# Patient Record
Sex: Male | Born: 1950 | Race: Black or African American | Hispanic: No | Marital: Single | Smoking: Former smoker
Health system: Southern US, Community
[De-identification: ages and names within clinical notes are randomized; demographics above are authoritative.]

## PROBLEM LIST (undated history)

## (undated) DIAGNOSIS — T148XXA Other injury of unspecified body region, initial encounter: Secondary | ICD-10-CM

## (undated) DIAGNOSIS — Z8619 Personal history of other infectious and parasitic diseases: Secondary | ICD-10-CM

## (undated) DIAGNOSIS — M1990 Unspecified osteoarthritis, unspecified site: Secondary | ICD-10-CM

## (undated) HISTORY — DX: Personal history of other infectious and parasitic diseases: Z86.19

## (undated) HISTORY — PX: TOTAL HIP ARTHROPLASTY: SHX124

## (undated) HISTORY — DX: Other injury of unspecified body region, initial encounter: T14.8XXA

## (undated) HISTORY — DX: Unspecified osteoarthritis, unspecified site: M19.90

## (undated) HISTORY — PX: ABDOMINAL SURGERY: SHX537

---

## 1988-05-29 DIAGNOSIS — T148XXA Other injury of unspecified body region, initial encounter: Secondary | ICD-10-CM

## 1988-05-29 HISTORY — DX: Other injury of unspecified body region, initial encounter: T14.8XXA

## 2009-06-25 ENCOUNTER — Emergency Department (HOSPITAL_BASED_OUTPATIENT_CLINIC_OR_DEPARTMENT_OTHER): Admission: EM | Admit: 2009-06-25 | Discharge: 2009-06-25 | Payer: Self-pay | Admitting: Emergency Medicine

## 2009-07-10 ENCOUNTER — Emergency Department (HOSPITAL_BASED_OUTPATIENT_CLINIC_OR_DEPARTMENT_OTHER): Admission: EM | Admit: 2009-07-10 | Discharge: 2009-07-10 | Payer: Self-pay | Admitting: Emergency Medicine

## 2009-07-31 ENCOUNTER — Emergency Department (HOSPITAL_BASED_OUTPATIENT_CLINIC_OR_DEPARTMENT_OTHER): Admission: EM | Admit: 2009-07-31 | Discharge: 2009-07-31 | Payer: Self-pay | Admitting: Emergency Medicine

## 2010-03-08 ENCOUNTER — Emergency Department (HOSPITAL_BASED_OUTPATIENT_CLINIC_OR_DEPARTMENT_OTHER): Admission: EM | Admit: 2010-03-08 | Discharge: 2010-03-08 | Payer: Self-pay | Admitting: Emergency Medicine

## 2010-04-15 ENCOUNTER — Emergency Department (HOSPITAL_BASED_OUTPATIENT_CLINIC_OR_DEPARTMENT_OTHER): Admission: EM | Admit: 2010-04-15 | Discharge: 2010-04-15 | Payer: Self-pay | Admitting: Emergency Medicine

## 2010-04-26 ENCOUNTER — Emergency Department (HOSPITAL_BASED_OUTPATIENT_CLINIC_OR_DEPARTMENT_OTHER)
Admission: EM | Admit: 2010-04-26 | Discharge: 2010-04-26 | Payer: Self-pay | Source: Home / Self Care | Admitting: Emergency Medicine

## 2010-05-09 ENCOUNTER — Emergency Department (HOSPITAL_BASED_OUTPATIENT_CLINIC_OR_DEPARTMENT_OTHER)
Admission: EM | Admit: 2010-05-09 | Discharge: 2010-05-09 | Payer: Self-pay | Source: Home / Self Care | Admitting: Emergency Medicine

## 2010-05-24 ENCOUNTER — Ambulatory Visit: Payer: Self-pay | Admitting: Family

## 2010-05-27 ENCOUNTER — Ambulatory Visit (HOSPITAL_BASED_OUTPATIENT_CLINIC_OR_DEPARTMENT_OTHER)
Admission: RE | Admit: 2010-05-27 | Discharge: 2010-05-27 | Payer: Self-pay | Source: Home / Self Care | Attending: Internal Medicine | Admitting: Internal Medicine

## 2010-05-27 ENCOUNTER — Telehealth: Payer: Self-pay | Admitting: Family

## 2010-05-27 DIAGNOSIS — M79606 Pain in leg, unspecified: Secondary | ICD-10-CM

## 2010-05-27 DIAGNOSIS — R683 Clubbing of fingers: Secondary | ICD-10-CM | POA: Insufficient documentation

## 2010-05-27 DIAGNOSIS — G8929 Other chronic pain: Secondary | ICD-10-CM

## 2010-06-13 ENCOUNTER — Inpatient Hospital Stay (HOSPITAL_COMMUNITY)
Admission: RE | Admit: 2010-06-13 | Discharge: 2010-06-16 | Payer: Self-pay | Source: Home / Self Care | Attending: Orthopedic Surgery | Admitting: Orthopedic Surgery

## 2010-06-13 HISTORY — PX: JOINT REPLACEMENT: SHX530

## 2010-06-13 LAB — DIFFERENTIAL
Basophils Absolute: 0 10*3/uL (ref 0.0–0.1)
Basophils Relative: 1 % (ref 0–1)
Eosinophils Absolute: 0.1 10*3/uL (ref 0.0–0.7)
Eosinophils Relative: 1 % (ref 0–5)
Lymphocytes Relative: 40 % (ref 12–46)
Lymphs Abs: 1.4 10*3/uL (ref 0.7–4.0)
Monocytes Absolute: 0.7 10*3/uL (ref 0.1–1.0)
Monocytes Relative: 19 % — ABNORMAL HIGH (ref 3–12)
Neutro Abs: 1.4 10*3/uL — ABNORMAL LOW (ref 1.7–7.7)
Neutrophils Relative %: 40 % — ABNORMAL LOW (ref 43–77)

## 2010-06-13 LAB — CBC
HCT: 43.5 % (ref 39.0–52.0)
Hemoglobin: 15.5 g/dL (ref 13.0–17.0)
MCH: 31.6 pg (ref 26.0–34.0)
MCHC: 35.6 g/dL (ref 30.0–36.0)
MCV: 88.8 fL (ref 78.0–100.0)
Platelets: 157 10*3/uL (ref 150–400)
RBC: 4.9 MIL/uL (ref 4.22–5.81)
RDW: 13 % (ref 11.5–15.5)
WBC: 3.5 10*3/uL — ABNORMAL LOW (ref 4.0–10.5)

## 2010-06-13 LAB — HEPATIC FUNCTION PANEL
ALT: 114 U/L — ABNORMAL HIGH (ref 0–53)
AST: 119 U/L — ABNORMAL HIGH (ref 0–37)
Albumin: 4 g/dL (ref 3.5–5.2)
Alkaline Phosphatase: 70 U/L (ref 39–117)
Bilirubin, Direct: 0.4 mg/dL — ABNORMAL HIGH (ref 0.0–0.3)
Indirect Bilirubin: 0.9 mg/dL (ref 0.3–0.9)
Total Bilirubin: 1.3 mg/dL — ABNORMAL HIGH (ref 0.3–1.2)
Total Protein: 7.8 g/dL (ref 6.0–8.3)

## 2010-06-13 LAB — TYPE AND SCREEN
ABO/RH(D): A POS
Antibody Screen: NEGATIVE

## 2010-06-13 LAB — PROTIME-INR
INR: 0.96 (ref 0.00–1.49)
Prothrombin Time: 13 seconds (ref 11.6–15.2)

## 2010-06-13 LAB — BASIC METABOLIC PANEL
BUN: 6 mg/dL (ref 6–23)
CO2: 28 mEq/L (ref 19–32)
Calcium: 9.7 mg/dL (ref 8.4–10.5)
Chloride: 101 mEq/L (ref 96–112)
Creatinine, Ser: 0.86 mg/dL (ref 0.4–1.5)
GFR calc Af Amer: >60 mL/min (ref >60)
GFR calc non Af Amer: >60 mL/min (ref >60)
Glucose, Bld: 92 mg/dL (ref 70–99)
Potassium: 4.6 mEq/L (ref 3.5–5.1)
Sodium: 135 mEq/L (ref 135–145)

## 2010-06-13 LAB — URINALYSIS, ROUTINE W REFLEX MICROSCOPIC
Bilirubin Urine: NEGATIVE
Hgb urine dipstick: NEGATIVE
Ketones, ur: NEGATIVE mg/dL
Nitrite: POSITIVE — AB
Protein, ur: NEGATIVE mg/dL
Specific Gravity, Urine: 1.014 (ref 1.005–1.030)
Urine Glucose, Fasting: NEGATIVE mg/dL
Urobilinogen, UA: 4 mg/dL — ABNORMAL HIGH (ref 0.0–1.0)
pH: 6.5 (ref 5.0–8.0)

## 2010-06-13 LAB — APTT: aPTT: 37 seconds (ref 24–37)

## 2010-06-13 LAB — SURGICAL PCR SCREEN
MRSA, PCR: NEGATIVE
Staphylococcus aureus: NEGATIVE

## 2010-06-13 LAB — URINE MICROSCOPIC-ADD ON

## 2010-06-13 LAB — ABO/RH: ABO/RH(D): A POS

## 2010-06-15 LAB — BASIC METABOLIC PANEL
BUN: 7 mg/dL (ref 6–23)
CO2: 28 mEq/L (ref 19–32)
Calcium: 8.1 mg/dL — ABNORMAL LOW (ref 8.4–10.5)
Chloride: 101 mEq/L (ref 96–112)
Creatinine, Ser: 0.94 mg/dL (ref 0.4–1.5)
GFR calc Af Amer: >60 mL/min (ref >60)
GFR calc non Af Amer: >60 mL/min (ref >60)
Glucose, Bld: 124 mg/dL — ABNORMAL HIGH (ref 70–99)
Potassium: 3.7 mEq/L (ref 3.5–5.1)
Sodium: 133 mEq/L — ABNORMAL LOW (ref 135–145)

## 2010-06-15 LAB — CBC
HCT: 26.8 % — ABNORMAL LOW (ref 39.0–52.0)
Hemoglobin: 9.5 g/dL — ABNORMAL LOW (ref 13.0–17.0)
MCH: 31 pg (ref 26.0–34.0)
MCHC: 35.4 g/dL (ref 30.0–36.0)
MCV: 87.6 fL (ref 78.0–100.0)
Platelets: 125 10*3/uL — ABNORMAL LOW (ref 150–400)
RBC: 3.06 MIL/uL — ABNORMAL LOW (ref 4.22–5.81)
RDW: 12.6 % (ref 11.5–15.5)
WBC: 5.3 10*3/uL (ref 4.0–10.5)

## 2010-06-15 LAB — PROTIME-INR
INR: 1.12 (ref 0.00–1.49)
Prothrombin Time: 14.6 seconds (ref 11.6–15.2)
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
## 2010-06-20 LAB — CBC
HCT: 25.3 % — ABNORMAL LOW (ref 39.0–52.0)
HCT: 24.3 % — ABNORMAL LOW (ref 39.0–52.0)
Hemoglobin: 8.8 g/dL — ABNORMAL LOW (ref 13.0–17.0)
Hemoglobin: 8.4 g/dL — ABNORMAL LOW (ref 13.0–17.0)
MCH: 30.8 pg (ref 26.0–34.0)
MCH: 30.4 pg (ref 26.0–34.0)
MCHC: 34.8 g/dL (ref 30.0–36.0)
MCHC: 34.6 g/dL (ref 30.0–36.0)
MCV: 88.5 fL (ref 78.0–100.0)
MCV: 88 fL (ref 78.0–100.0)
Platelets: 116 10*3/uL — ABNORMAL LOW (ref 150–400)
Platelets: 141 10*3/uL — ABNORMAL LOW (ref 150–400)
RBC: 2.86 MIL/uL — ABNORMAL LOW (ref 4.22–5.81)
RBC: 2.76 MIL/uL — ABNORMAL LOW (ref 4.22–5.81)
RDW: 12.3 % (ref 11.5–15.5)
RDW: 12.2 % (ref 11.5–15.5)
WBC: 5.3 10*3/uL (ref 4.0–10.5)
WBC: 5.6 10*3/uL (ref 4.0–10.5)

## 2010-06-20 LAB — PROTIME-INR
INR: 1.44 (ref 0.00–1.49)
INR: 1.66 — ABNORMAL HIGH (ref 0.00–1.49)
Prothrombin Time: 17.7 seconds — ABNORMAL HIGH (ref 11.6–15.2)
Prothrombin Time: 19.8 seconds — ABNORMAL HIGH (ref 11.6–15.2)

## 2010-06-21 ENCOUNTER — Ambulatory Visit: Admit: 2010-06-21 | Payer: Self-pay | Admitting: Family

## 2010-06-22 ENCOUNTER — Observation Stay (HOSPITAL_COMMUNITY)
Admission: EM | Admit: 2010-06-22 | Discharge: 2010-07-01 | Disposition: A | Discharge status: Skilled Nursing Facility | Payer: Medicare Other | Attending: Internal Medicine | Admitting: Internal Medicine

## 2010-06-22 DIAGNOSIS — F1410 Cocaine abuse, uncomplicated: Secondary | ICD-10-CM | POA: Insufficient documentation

## 2010-06-22 DIAGNOSIS — Z96649 Presence of unspecified artificial hip joint: Secondary | ICD-10-CM | POA: Insufficient documentation

## 2010-06-22 DIAGNOSIS — M25559 Pain in unspecified hip: Principal | ICD-10-CM | POA: Insufficient documentation

## 2010-06-22 DIAGNOSIS — I10 Essential (primary) hypertension: Secondary | ICD-10-CM | POA: Insufficient documentation

## 2010-06-22 DIAGNOSIS — D62 Acute posthemorrhagic anemia: Secondary | ICD-10-CM | POA: Insufficient documentation

## 2010-06-22 DIAGNOSIS — R29898 Other symptoms and signs involving the musculoskeletal system: Secondary | ICD-10-CM | POA: Insufficient documentation

## 2010-06-22 DIAGNOSIS — Z7901 Long term (current) use of anticoagulants: Secondary | ICD-10-CM | POA: Insufficient documentation

## 2010-06-22 LAB — DIFFERENTIAL
Basophils Absolute: 0 10*3/uL (ref 0.0–0.1)
Basophils Relative: 0 % (ref 0–1)
Eosinophils Absolute: 0 10*3/uL (ref 0.0–0.7)
Eosinophils Relative: 0 % (ref 0–5)
Lymphocytes Relative: 11 % — ABNORMAL LOW (ref 12–46)
Lymphs Abs: 1.4 10*3/uL (ref 0.7–4.0)
Monocytes Absolute: 1.9 10*3/uL — ABNORMAL HIGH (ref 0.1–1.0)
Monocytes Relative: 15 % — ABNORMAL HIGH (ref 3–12)
Neutro Abs: 9.3 10*3/uL — ABNORMAL HIGH (ref 1.7–7.7)
Neutrophils Relative %: 74 % (ref 43–77)

## 2010-06-22 LAB — RAPID URINE DRUG SCREEN, HOSP PERFORMED
Amphetamines: NOT DETECTED
Barbiturates: NOT DETECTED
Benzodiazepines: NOT DETECTED
Cocaine: POSITIVE — AB
Opiates: POSITIVE — AB
Tetrahydrocannabinol: NOT DETECTED

## 2010-06-22 LAB — POCT I-STAT, CHEM 8
BUN: 34 mg/dL — ABNORMAL HIGH (ref 6–23)
Calcium, Ion: 1.18 mmol/L (ref 1.12–1.32)
Chloride: 104 mEq/L (ref 96–112)
Creatinine, Ser: 1.2 mg/dL (ref 0.4–1.5)
Glucose, Bld: 133 mg/dL — ABNORMAL HIGH (ref 70–99)
HCT: 26 % — ABNORMAL LOW (ref 39.0–52.0)
Hemoglobin: 8.8 g/dL — ABNORMAL LOW (ref 13.0–17.0)
Potassium: 4.9 mEq/L (ref 3.5–5.1)
Sodium: 136 mEq/L (ref 135–145)
TCO2: 26 mmol/L (ref 0–100)

## 2010-06-22 LAB — CBC
HCT: 24.6 % — ABNORMAL LOW (ref 39.0–52.0)
Hemoglobin: 8.5 g/dL — ABNORMAL LOW (ref 13.0–17.0)
MCH: 30.7 pg (ref 26.0–34.0)
MCHC: 34.6 g/dL (ref 30.0–36.0)
MCV: 88.8 fL (ref 78.0–100.0)
Platelets: 398 10*3/uL (ref 150–400)
RBC: 2.77 MIL/uL — ABNORMAL LOW (ref 4.22–5.81)
RDW: 14.3 % (ref 11.5–15.5)
WBC: 12.6 10*3/uL — ABNORMAL HIGH (ref 4.0–10.5)

## 2010-06-22 LAB — PROTIME-INR
INR: 4.98 — ABNORMAL HIGH (ref 0.00–1.49)
Prothrombin Time: 46.1 seconds — ABNORMAL HIGH (ref 11.6–15.2)

## 2010-06-23 LAB — CBC
MCH: 29.5 pg (ref 26.0–34.0)
Platelets: 318 10*3/uL (ref 150–400)
RBC: 2.51 MIL/uL — ABNORMAL LOW (ref 4.22–5.81)
RDW: 14.3 % (ref 11.5–15.5)

## 2010-06-23 LAB — BASIC METABOLIC PANEL
CO2: 25 mEq/L (ref 19–32)
Calcium: 8.7 mg/dL (ref 8.4–10.5)
Creatinine, Ser: 0.97 mg/dL (ref 0.4–1.5)
GFR calc Af Amer: >60 mL/min (ref >60)

## 2010-06-23 LAB — PROTIME-INR: INR: 4.12 — ABNORMAL HIGH (ref 0.00–1.49)

## 2010-06-24 LAB — DIFFERENTIAL
Basophils Absolute: 0 10*3/uL (ref 0.0–0.1)
Basophils Relative: 1 % (ref 0–1)
Lymphocytes Relative: 31 % (ref 12–46)
Neutro Abs: 3.6 10*3/uL (ref 1.7–7.7)
Neutrophils Relative %: 55 % (ref 43–77)

## 2010-06-24 LAB — CBC
HCT: 20.5 % — ABNORMAL LOW (ref 39.0–52.0)
Hemoglobin: 6.8 g/dL — CL (ref 13.0–17.0)
WBC: 6.6 10*3/uL (ref 4.0–10.5)

## 2010-06-24 LAB — MAGNESIUM: Magnesium: 2.1 mg/dL (ref 1.5–2.5)

## 2010-06-24 LAB — COMPREHENSIVE METABOLIC PANEL
ALT: 50 U/L (ref 0–53)
BUN: 14 mg/dL (ref 6–23)
Calcium: 8.2 mg/dL — ABNORMAL LOW (ref 8.4–10.5)
Creatinine, Ser: 0.89 mg/dL (ref 0.4–1.5)
Glucose, Bld: 90 mg/dL (ref 70–99)
Sodium: 138 mEq/L (ref 135–145)
Total Protein: 5.4 g/dL — ABNORMAL LOW (ref 6.0–8.3)

## 2010-06-24 LAB — PHOSPHORUS
Phosphorus: 3 mg/dL (ref 2.3–4.6)
Phosphorus: 2.7 mg/dL (ref 2.3–4.6)

## 2010-06-24 LAB — FOLATE: Folate: 7.9 ng/mL

## 2010-06-24 LAB — FERRITIN: Ferritin: 701 ng/mL — ABNORMAL HIGH (ref 22–322)

## 2010-06-24 LAB — IRON AND TIBC
Iron: 62 ug/dL (ref 42–135)
UIBC: 118 ug/dL

## 2010-06-24 LAB — ABO/RH: ABO/RH(D): A POS

## 2010-06-24 LAB — PROTIME-INR: Prothrombin Time: 18.7 seconds — ABNORMAL HIGH (ref 11.6–15.2)

## 2010-06-24 LAB — HEMOCCULT GUIAC POC 1CARD (OFFICE): Fecal Occult Bld: NEGATIVE

## 2010-06-25 LAB — DIFFERENTIAL
Basophils Absolute: 0 10*3/uL (ref 0.0–0.1)
Basophils Relative: 0 % (ref 0–1)
Eosinophils Relative: 2 % (ref 0–5)
Lymphocytes Relative: 23 % (ref 12–46)
Monocytes Absolute: 0.9 10*3/uL (ref 0.1–1.0)

## 2010-06-25 LAB — CBC
HCT: 23.7 % — ABNORMAL LOW (ref 39.0–52.0)
MCHC: 34.2 g/dL (ref 30.0–36.0)
Platelets: 333 10*3/uL (ref 150–400)
RDW: 16.4 % — ABNORMAL HIGH (ref 11.5–15.5)
WBC: 7.2 10*3/uL (ref 4.0–10.5)

## 2010-06-25 LAB — BASIC METABOLIC PANEL
Calcium: 8.5 mg/dL (ref 8.4–10.5)
GFR calc Af Amer: >60 mL/min (ref >60)
GFR calc non Af Amer: >60 mL/min (ref >60)
Potassium: 4.1 mEq/L (ref 3.5–5.1)
Sodium: 139 mEq/L (ref 135–145)

## 2010-06-25 LAB — PROTIME-INR
INR: 1.12 (ref 0.00–1.49)
Prothrombin Time: 14.6 seconds (ref 11.6–15.2)

## 2010-06-25 LAB — MAGNESIUM: Magnesium: 2.1 mg/dL (ref 1.5–2.5)

## 2010-06-26 LAB — CBC
HCT: 24.5 % — ABNORMAL LOW (ref 39.0–52.0)
MCHC: 33.9 g/dL (ref 30.0–36.0)
Platelets: 382 10*3/uL (ref 150–400)
RDW: 17.3 % — ABNORMAL HIGH (ref 11.5–15.5)
WBC: 8.1 10*3/uL (ref 4.0–10.5)

## 2010-06-26 LAB — PROTIME-INR: INR: 1.04 (ref 0.00–1.49)
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
## 2010-06-27 ENCOUNTER — Telehealth: Payer: Self-pay | Admitting: Family

## 2010-06-27 LAB — PROTIME-INR: Prothrombin Time: 13.6 seconds (ref 11.6–15.2)

## 2010-06-28 LAB — URINE MICROSCOPIC-ADD ON

## 2010-06-28 LAB — RAPID URINE DRUG SCREEN, HOSP PERFORMED
Amphetamines: NOT DETECTED
Barbiturates: NOT DETECTED
Tetrahydrocannabinol: NOT DETECTED

## 2010-06-28 LAB — PROTIME-INR: INR: 1.02 (ref 0.00–1.49)

## 2010-06-28 LAB — TYPE AND SCREEN
ABO/RH(D): A POS
Antibody Screen: NEGATIVE
Unit division: 0
Unit division: 0

## 2010-06-28 LAB — T4, FREE: Free T4: 1.09 ng/dL (ref 0.80–1.80)

## 2010-06-28 LAB — URINALYSIS, ROUTINE W REFLEX MICROSCOPIC
Nitrite: NEGATIVE
Specific Gravity, Urine: 1.011 (ref 1.005–1.030)
Urobilinogen, UA: 1 mg/dL (ref 0.0–1.0)

## 2010-06-29 LAB — PROTIME-INR
INR: 1.01 (ref 0.00–1.49)
Prothrombin Time: 13.5 seconds (ref 11.6–15.2)

## 2010-06-30 ENCOUNTER — Encounter: Payer: Self-pay | Admitting: Family

## 2010-06-30 LAB — PROTIME-INR
INR: 1.06 (ref 0.00–1.49)
Prothrombin Time: 14 seconds (ref 11.6–15.2)

## 2010-06-30 NOTE — Progress Notes (Signed)
Summary: Xray Results  Phone Note Outgoing Call   Summary of Call: Please call patient and let him know that his x-ray looks normal.  Hip x-ray shows severe degeneration of the right hip.  I will refer him to orthopedics for evaluation.   Initial call taken by: Lemont Fillers FNP,  May 27, 2010 1:06 PM  Follow-up for Phone Call        call placed to patient at [phone], male individiual stated patient was not available. Message was left for patient to return call. Follow-up by: Glendell Docker CMA,  May 27, 2010 1:54 PM  Additional Follow-up for Phone Call Additional follow up Details #1::        Left message with pt's father to have pt return my call. Nicki Guadalajara Fergerson CMA Duncan Dull)  May 31, 2010 9:27 AM     Additional Follow-up for Phone Call Additional follow up Details #2::    Pt notified and states he is going to Ortho today. Nicki Guadalajara Fergerson CMA Duncan Dull)  June 01, 2010 8:50 AM

## 2010-06-30 NOTE — Progress Notes (Signed)
Summary: FYI  Phone Note Call from Patient   Caller: Patient Summary of Call: FYI:  Pt stated he was just going to wait for pain management to contact him about an appt and gave papers back to me for xrays. He states that Tylenol does not help him and he did not understand why he was referred to [place] anyway. I asked him if he had a primary care doctor and he said no he was going to get one. I advised pt that Efraim Kaufmann is a Nature conservation officer and he asked for his papers back. I gave pt instruction sheet and xray orders back. Fleet Contras in radiology states that pt did have xrays completed today. Nicki Guadalajara Fergerson CMA Duncan Dull)  May 27, 2010 11:27 AM
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
## 2010-07-01 ENCOUNTER — Encounter: Payer: Self-pay | Admitting: Family

## 2010-07-01 LAB — PROTIME-INR: Prothrombin Time: 13.8 seconds (ref 11.6–15.2)

## 2010-07-03 NOTE — Discharge Summary (Signed)
  NAME:  Randy [name], Randy [name] NO.:  [id number]  MEDICAL RECORD NO.:  [id number]          PATIENT TYPE:  OBV  LOCATION:  1521                         FACILITY[facility]  PHYSICIAN:  Osvaldo Shipper, MD     DATE OF BIRTH:  [birth year]  DATE OF ADMISSION:  06/22/2010 DATE OF DISCHARGE:                        DISCHARGE SUMMARY - REFERRING   ADDENDUM  The patient ultimately is going to the skilled nursing facility today, July 01, 2010.  Please review the discharge summary dictated on February 1 for details regarding the patient's inpatient stay.  There are no significant changes made to that discharge summary.  The medications list is the same as before.  Basically the last two days the patient has not had any new issues.  The only thing that we have noticed is that his supine blood pressure tends sometimes to run high.  It was 160/83 this morning. When he stands up it drops to 109/77.  I would recommend that the nursing facility monitor his blood pressure closely and if he continues to remain hypertensive consider antihypertensive agents.  Otherwise other recommendations as dictated on the other discharge summary.  On the day of discharge the patient is feeling well.  He denies any complaints.  He has been eating well.  He is wondering when he can take a bath and I told him that he can take a bath when he gets to his skilled nursing facility.  PHYSICAL EXAMINATION:  His vital signs, as I mentioned above except for his blood pressure, everything else is quite stable.  He is saturating 100% on room air.  His lungs are clear.  Cardiovascular exam was benign. Really no changes on examination.  He does continue to have weakness in the right leg, especially in the ankle flexors and extensors and once again, he will need followup with Dr. Turner Daniels for the same.  ASSESSMENT AND PLAN:  Essentially no change to assessment and plan.  The patient will be going to the skilled  nursing facility later today.     Osvaldo Shipper, MD     GK/MEDQ  D:  07/01/2010  T:  07/01/2010  Job:  [id number]  Electronically Signed by Osvaldo Shipper MD on 07/03/2010 04:46:48 PM

## 2010-07-03 NOTE — Discharge Summary (Signed)
NAME:  Randy, [name] NO.:  [id number]  MEDICAL RECORD NO.:  [id number]          PATIENT TYPE:  OBV  LOCATION:  1521                         FACILITY:  [facility]  PHYSICIAN:  Osvaldo Shipper, MD     DATE OF BIRTH:  1951/05/02  DATE OF ADMISSION:  06/22/2010 DATE OF DISCHARGE:  06/29/2010                        DISCHARGE SUMMARY - REFERRING   I am anticipating that he will be able to go to the skilled nursing facility later today.  CONSULTATIONS:  Consultation during this admission include Dr. [name] from Orthopedics Department.  Imaging studies done during this admission include a right hip film which showed no acute bony findings, stable position and appearance of the right hip prosthesis.  DISCHARGE DIAGNOSES: 1. Right hip pain and weakness, etiology unclear, will require further     outpatient evaluation. 2. History of polysubstance abuse with cocaine positive in the urine. 3. History of narcotic drug seeking behavior. 4. History of acute blood loss anemia, stable. 5. History of hypertension, stable.  BRIEF HOSPITAL COURSE:  Briefly, this is a 60 year old African-American male who presents to the hospital with complaints of weakness in the right lower extremity.  He had motor deficits in the ankle as well as in his toes.  He had numbness and sensory deficits there as well.  He underwent hip arthroplasty on June 14, 2010, done by Dr. Turner [name].  It was felt that it may have been a complication of the surgery, so Dr. Turner [name] was consulted.  Dr. Turner [name] felt that the patient may require skilled nursing facility placement.  He feels that we need to wait about 2 to 3 weeks to see if there is any improvement in his weakness and if there is none, then EMG and nerve conduction studies may have to be done as an outpatient.  So this will be recommended.  The patient also had some drug seeking behavior for which he was taken off his narcotics completely and he was  put just on NSAIDs. 1. He has history of cocaine abuse for which he was counseled. 2. He had acute to blood loss anemia with hemoglobin dropping into 6.8     for which he was transfused.  Coumadin was held.  It was felt that     the blood loss was into the right thigh.  Hemoglobin has been     stable since. 3. History of hypertension which is stable as well.  We would recommend close monitoring of his blood pressures and initiating antihypertensive treatment as needed.  DISCHARGE PHYSICAL EXAMINATION:  Today that is June 29, 2010, the patient still has the right lower extremity pain and weakness.  However, he denies any other complaints.  He has been able to ambulate with a walker.  His vital signs are all stable.  Blood pressure this morning was 144/75.  Other vital signs are stable.  His lungs are clear to auscultation bilaterally with no wheezing, rales or rhonchi. Cardiovascular, S1, S2 normal and regular.  No S3, S4, rubs, murmurs or bruit.  Abdomen is soft, nontender, nondistended.  Bowel sounds are present.  No masses, organomegaly appreciated.  Right  hip area with the wound appeared to be stable.  No infection is noted.  He still has weakness on the right lower extremity especially in the ankle flexors.  DISCHARGE MEDICATIONS: 1. Acetaminophen 650 mg every 4 hours as needed for pain. 2. Ensure 1 can p.o. b.i.d. 3. Ferrous sulfate 325 mg twice daily for 30 days. 4. Ibuprofen 800 mg every 6 hours as needed for pain. 5. Robaxin 500 mg every 4 hours as needed for pain. 6. MiraLax 17 g p.o. daily. 7. Senokot 2 tablets daily as needed for constipation. 8. Dulera 1 puff inhaled twice daily as needed.  DIET:  Heart healthy.  ACTIVITY:  Physical activity per PT, OT.  OTHER RECOMMENDATIONS:  Nerve conduction velocities and electromyelogram to be done in 3 to 4 weeks per Dr. Turner [name].  Please schedule appointment with Dr. Turner [name] in 2 weeks' time so that this can be facilitated;    With Dr. Kelle Darting who will be the patient's primary care physician.  Total time on this discharge encounter is 35 minutes.  Osvaldo Shipper, MD     GK/MEDQ  D:  06/29/2010  T:  06/29/2010  Job:  [id number]  cc:   Feliberto Gottron. Randy [name], M.D. Fax: [phone]  Eugenio Hoes. Tawanna Cooler, MD [address] [place] [address]  Electronically Signed by Osvaldo Shipper MD on 07/03/2010 04:46:15 PM

## 2010-07-06 NOTE — Progress Notes (Signed)
Summary: needs appt, mailed letter  Phone Note Outgoing Call   Summary of Call: Please call patient and arrange a post-hospital follow up in 1 week. Initial call taken by: Lemont Fillers FNP,  June 27, 2010 4:38 PM  Follow-up for Phone Call        Left message for pt to return my call. Nicki Guadalajara Fergerson CMA Duncan Dull)  June 28, 2010 11:48 AM   Left message on machine to return my call. Nicki Guadalajara Fergerson CMA Duncan Dull)  June 30, 2010 10:01 AM   Additional Follow-up for Phone Call Additional follow up Details #1::        Left message on machine to return my call. Contact letter has been mailed to pt to schedule a hospital follow up.  Additional Follow-up by: Mervin Kung CMA Duncan Dull),  July 01, 2010 10:52 AM

## 2010-07-06 NOTE — Letter (Signed)
Summary: Generic Letter  [place] [facility]  [address]   [place], [place] [address]   Phone: [phone]  Fax: [phone]     07/01/2010    [facility] [address] [place], [place]  [address]  Dear Mr. Creary,  Recently we received notification that you were discharged from the hospital and needed to be seen for follow up. We have been unsuccessful in our attempts to reach you by phone.   Please call our office at [phone] Monday through Friday from 8am to 5pm to schedule your follow up appointment.   Sincerely,    Mervin Kung CMA (AAMA)  Appended Document: Generic Letter Mailed.
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
## 2010-07-08 NOTE — Discharge Summary (Signed)
NAME:  Randy [name], Randy [name] NO.:  [id number]  MEDICAL RECORD NO.:  [id number]          PATIENT TYPE:  INP  LOCATION:  5031                         FACILITY:  [facility]  PHYSICIAN:  Feliberto Gottron. Turner Daniels, M.D.   DATE OF BIRTH:  Oct 20, 1950  DATE OF ADMISSION:  06/13/2010 DATE OF DISCHARGE:  06/16/2010                              DISCHARGE SUMMARY   CHIEF COMPLAINT:  Right hip pain.  HISTORY OF PRESENT ILLNESS:  This is a 60 year old gentleman who complains of severe unremitting pain in his right hip despite extensive conservative treatment.  He now desires a surgical intervention.  All risks and benefits of surgery were discussed with the patient.  PAST MEDICAL HISTORY:  Significant for hepatitis B.  PAST SURGICAL HISTORY:  Significant for abdominal surgery.  SOCIAL HISTORY:  He denies the use of alcohol or tobacco, but admits to recreational drug use.  FAMILY HISTORY:  Noncontributory.  ALLERGIES:  He has an allergy to MORPHINE.  PHYSICAL EXAMINATION:  Gross examination of the right hip demonstrates significant pain with attempted internal or external rotation.  He has a negative foot tap and is neurovascular intact.  X-rays of the right hip demonstrate bone-on-bone degenerative joint disease.  PREOP LABORATORY DATA:  White blood cells 3.5, red blood cells 4.9, hemoglobin 15.5, hematocrit 43.5, and platelets 157.  PT 13, INR 0.96, PTT 37.  Sodium 135, potassium 4.6, chloride 101, glucose 92, BUN 6, and creatinine 0.86.  Urinalysis demonstrates trace leukocyte esterase, otherwise within normal limits.  HOSPITAL COURSE:  Randy [name] was admitted to [facility] on June 13, 2010, when he underwent right total hip arthroplasty.  The procedure was performed by Dr. [name] and the patient tolerated it well. Perioperative Foley catheter was placed and he was transferred to the floor on Lovenox and Coumadin for DVT prophylaxis.  On the first postoperative day, he was  awake and alert and reporting good pain control.  Hemoglobin was 9.5 and surgical dressing had scant drainage and he was evaluated by Physical Therapy.  On the second postoperative day, Randy [name] continued to report good pain control.  Hemoglobin was 8.8.  He denied any dizziness or shortness of breath.  He was progressing well with physical therapy.  On postoperative day #3, he was eating well and ambulating independently.  Hemoglobin was 8.4, but he continued to deny any symptoms of anemia.  He was discharged home.  DISPOSITION:  The patient was discharged home on June 16, 2010.  He was weightbearing as tolerated and would return to the clinic in 10 days for x-rays and suture removal.  Home health care would manage his wound, Coumadin, and physical therapy.  He would be on Coumadin for a total of 2 weeks with a target INR of 1.5-2.  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     Shirl Harris, PA   ______________________________ Feliberto Gottron. Turner Daniels, M.D.    JW/MEDQ  D:  07/05/2010  T:  07/05/2010  Job:  [id number]  Electronically Signed by Shirl Harris PA on 07/07/2010 04:54:09 PM Electronically Signed by [name] M.D. on 07/08/2010  10:07:29 PM

## 2010-07-11 ENCOUNTER — Encounter: Payer: Self-pay | Admitting: Family

## 2010-07-11 ENCOUNTER — Ambulatory Visit: Payer: Medicare Other | Admitting: Family

## 2010-07-11 ENCOUNTER — Telehealth: Payer: Self-pay | Admitting: Family

## 2010-07-11 DIAGNOSIS — Z8619 Personal history of other infectious and parasitic diseases: Secondary | ICD-10-CM

## 2010-07-11 DIAGNOSIS — M25559 Pain in unspecified hip: Secondary | ICD-10-CM

## 2010-07-11 DIAGNOSIS — B182 Chronic viral hepatitis C: Secondary | ICD-10-CM | POA: Insufficient documentation

## 2010-07-11 LAB — CONVERTED CEMR LAB: Hep A IgM: NEGATIVE

## 2010-07-13 ENCOUNTER — Telehealth: Payer: Self-pay | Admitting: Family

## 2010-07-13 ENCOUNTER — Encounter: Payer: Self-pay | Admitting: Family

## 2010-07-14 ENCOUNTER — Encounter (INDEPENDENT_AMBULATORY_CARE_PROVIDER_SITE_OTHER): Payer: Self-pay | Admitting: *Deleted

## 2010-07-20 NOTE — Progress Notes (Signed)
Summary: PT referral  Phone Note Outgoing Call   Summary of Call: Could you please call Dr. Wadie Lessen office in AM and let them know that we are going to arrange [facility] PT for Mr.  Carton.  Does he have any restrictions with PT at this point- weight bearing, range of motion etc.? Initial call taken by: Lemont Fillers FNP,  July 11, 2010 4:44 PM  Follow-up for Phone Call        Per Darl Pikes, nurse at [facility], pt has no restrictions for PT. Spoke with patient and let him know that referral has been made.  Also, confirmed with patient that he is no longer on coumadin.  He tells me that Dr. Turner Daniels discontinued this medication. Follow-up by: Mervin Kung CMA Duncan Dull),  July 12, 2010 8:31 AM

## 2010-07-20 NOTE — Letter (Signed)
Summary: Primary Care Consult Scheduled Letter  [place] at [facility]  [address]   [place], [place] [address]   Phone: [phone]  Fax: [phone]      07/14/2010 MRN: [id number]  [facility] [address] [place], [place]  [address]  [place]    Dear Randy [name],      We have scheduled an appointment for you.  At the recommendation of MELISSA O'SULLIVAN,FNP, we have scheduled you a consult with DR Suszanne Finch GASTROENTEROLOGY  on [facility] at 8:30AM  ARRIVE 8:15AM .  Their address is_[address]  [address]  . The office phone number is [phone].  If this appointment day and time is not convenient for you, please feel free to call the office of the doctor you are being referred to at the number listed above and reschedule the appointment.     It is important for you to keep your scheduled appointments. We are here to make sure you are given good patient care.  Thank you,  Darral Dash Patient Care Coordinator [place] at [facility]

## 2010-07-20 NOTE — Progress Notes (Signed)
  Phone Note Outgoing Call   Details for Reason: Hepatitis C Summary of Call: Called patient, reviewed hepatitis C + results noted.  Will plan referral to Dr. Marcelene Butte Hepatologist for further evaluation.  Pt was instructed to keep his upcoming apt in March.  Will initiate referral to Dr. Marcelene Butte Hepatologist in the meantime.  Initial call taken by: Lemont Fillers FNP,  July 13, 2010 2:42 PM  New Problems: HEPATITIS C (ICD-070.51)   New Problems: HEPATITIS C (ICD-070.51)
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
## 2010-07-20 NOTE — Progress Notes (Signed)
----   Converted from flag ---- ---- 07/13/2010 9:48 AM, Mervin Kung CMA (AAMA) wrote: Per Bonita Quin, test has been added.  ---- 07/13/2010 8:47 AM, Lemont Fillers FNP wrote: Please call [facility] and request that the following labs be added on:  AFP tumor marker [id number] diagnosis Hepatitis C ------------------------------

## 2010-07-26 NOTE — Medication Information (Signed)
Summary: Prescriptions at [facility]   Prescriptions at [facility]   Imported By: Maryln Gottron 07/21/2010 11:25:56  _____________________________________________________________________  External Attachment:    Type:   Image     Comment:   External Document

## 2010-08-02 ENCOUNTER — Ambulatory Visit: Payer: Medicare Other | Attending: Family | Admitting: Physical Therapy

## 2010-08-02 DIAGNOSIS — M25559 Pain in unspecified hip: Secondary | ICD-10-CM | POA: Insufficient documentation

## 2010-08-02 DIAGNOSIS — IMO0001 Reserved for inherently not codable concepts without codable children: Secondary | ICD-10-CM | POA: Insufficient documentation

## 2010-08-02 DIAGNOSIS — M6281 Muscle weakness (generalized): Secondary | ICD-10-CM | POA: Insufficient documentation

## 2010-08-02 DIAGNOSIS — M25659 Stiffness of unspecified hip, not elsewhere classified: Secondary | ICD-10-CM | POA: Insufficient documentation

## 2010-08-08 ENCOUNTER — Other Ambulatory Visit: Payer: Self-pay | Admitting: Internal Medicine

## 2010-08-08 ENCOUNTER — Ambulatory Visit (HOSPITAL_BASED_OUTPATIENT_CLINIC_OR_DEPARTMENT_OTHER)
Admission: RE | Admit: 2010-08-08 | Discharge: 2010-08-08 | Disposition: A | Discharge status: Home / Self Care | Payer: Medicare Other | Source: Ambulatory Visit | Attending: Internal Medicine | Admitting: Internal Medicine

## 2010-08-08 ENCOUNTER — Ambulatory Visit (INDEPENDENT_AMBULATORY_CARE_PROVIDER_SITE_OTHER): Payer: Medicare Other | Admitting: Family

## 2010-08-08 ENCOUNTER — Encounter: Payer: Self-pay | Admitting: Family

## 2010-08-08 DIAGNOSIS — B1710 Acute hepatitis C without hepatic coma: Secondary | ICD-10-CM

## 2010-08-08 DIAGNOSIS — Z96649 Presence of unspecified artificial hip joint: Secondary | ICD-10-CM | POA: Insufficient documentation

## 2010-08-08 DIAGNOSIS — M25559 Pain in unspecified hip: Secondary | ICD-10-CM

## 2010-08-09 ENCOUNTER — Ambulatory Visit: Payer: Medicare Other | Admitting: Rehabilitation

## 2010-08-09 ENCOUNTER — Telehealth: Payer: Self-pay | Admitting: Family

## 2010-08-11 ENCOUNTER — Ambulatory Visit: Payer: Medicare Other | Admitting: Rehabilitation

## 2010-08-11 ENCOUNTER — Ambulatory Visit: Payer: Medicare Other

## 2010-08-11 LAB — URINALYSIS, ROUTINE W REFLEX MICROSCOPIC
Glucose, UA: NEGATIVE mg/dL
Protein, ur: NEGATIVE mg/dL
pH: 5.5 (ref 5.0–8.0)

## 2010-08-11 LAB — URINE MICROSCOPIC-ADD ON

## 2010-08-11 LAB — BASIC METABOLIC PANEL
BUN: 12 mg/dL (ref 6–23)
GFR calc non Af Amer: >60 mL/min (ref >60)
Glucose, Bld: 80 mg/dL (ref 70–99)
Potassium: 4.2 mEq/L (ref 3.5–5.1)

## 2010-08-16 ENCOUNTER — Ambulatory Visit: Payer: Medicare Other | Admitting: Physical Therapy

## 2010-08-16 NOTE — Progress Notes (Signed)
Summary: lab result, twinrix  Phone Note Outgoing Call   Summary of Call: Please call patient and inform him that his liver ultrasound is normal.  Lab tests show that he has NOT had hepatitis B.  I recommend that he book a nurse visit to start Twinrix series.  This will be to protect him against hepatitis A and B. Initial call taken by: Lemont Fillers FNP,  August 09, 2010 8:17 AM  Follow-up for Phone Call        Left message on machine to return my call. Nicki Guadalajara Fergerson CMA Duncan Dull)  August 09, 2010 9:06 AM  Follow-up by: Mervin Kung CMA Duncan Dull),  August 09, 2010 9:07 AM  Additional Follow-up for Phone Call Additional follow up Details #1::        Pt returned my call and was advised per [facility] instruction. Pt scheduled 1st nurse visit for 08/11/10 @ 3:30. Nicki Guadalajara Fergerson CMA Duncan Dull)  August 09, 2010 4:55 PM
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
## 2010-08-18 ENCOUNTER — Ambulatory Visit: Payer: Medicare Other | Admitting: Physical Therapy

## 2010-08-25 ENCOUNTER — Ambulatory Visit: Payer: Medicare Other | Admitting: Physical Therapy

## 2010-08-29 ENCOUNTER — Ambulatory Visit: Payer: Medicare Other | Attending: Family | Admitting: Physical Therapy

## 2010-08-29 DIAGNOSIS — IMO0001 Reserved for inherently not codable concepts without codable children: Secondary | ICD-10-CM | POA: Insufficient documentation

## 2010-08-29 DIAGNOSIS — M25659 Stiffness of unspecified hip, not elsewhere classified: Secondary | ICD-10-CM | POA: Insufficient documentation

## 2010-08-29 DIAGNOSIS — M25559 Pain in unspecified hip: Secondary | ICD-10-CM | POA: Insufficient documentation

## 2010-08-29 DIAGNOSIS — M6281 Muscle weakness (generalized): Secondary | ICD-10-CM | POA: Insufficient documentation

## 2010-09-01 ENCOUNTER — Ambulatory Visit: Payer: Medicare Other | Admitting: Physical Therapy

## 2010-09-06 ENCOUNTER — Ambulatory Visit: Payer: Medicare Other | Admitting: Rehabilitation

## 2010-09-07 ENCOUNTER — Ambulatory Visit: Payer: Medicare Other | Admitting: Physical Therapy

## 2010-09-12 ENCOUNTER — Ambulatory Visit: Payer: Medicare Other | Admitting: Rehabilitation

## 2010-09-14 ENCOUNTER — Ambulatory Visit: Payer: Medicare Other | Admitting: Rehabilitation

## 2010-11-04 ENCOUNTER — Encounter: Payer: Self-pay | Admitting: Family

## 2010-11-07 ENCOUNTER — Ambulatory Visit: Payer: Medicare Other | Admitting: Family

## 2010-11-09 ENCOUNTER — Encounter: Payer: Self-pay | Admitting: Family

## 2010-11-09 ENCOUNTER — Ambulatory Visit (INDEPENDENT_AMBULATORY_CARE_PROVIDER_SITE_OTHER): Payer: Medicare Other | Admitting: Family

## 2010-11-09 ENCOUNTER — Telehealth: Payer: Self-pay | Admitting: Family

## 2010-11-09 DIAGNOSIS — M79671 Pain in right foot: Secondary | ICD-10-CM

## 2010-11-09 DIAGNOSIS — M21371 Foot drop, right foot: Secondary | ICD-10-CM | POA: Insufficient documentation

## 2010-11-09 DIAGNOSIS — B1710 Acute hepatitis C without hepatic coma: Secondary | ICD-10-CM

## 2010-11-09 DIAGNOSIS — IMO0002 Reserved for concepts with insufficient information to code with codable children: Secondary | ICD-10-CM

## 2010-11-09 DIAGNOSIS — M79609 Pain in unspecified limb: Secondary | ICD-10-CM

## 2010-11-09 DIAGNOSIS — M25559 Pain in unspecified hip: Secondary | ICD-10-CM

## 2010-11-09 MED ORDER — GABAPENTIN 100 MG PO CAPS: 100.0000 mg | ORAL_CAPSULE | Freq: Three times a day (TID) | ORAL | Status: DC

## 2010-11-09 NOTE — Assessment & Plan Note (Addendum)
His hospitalization labs were reviewed and it was noted that he has mild elevation of his liver function tests. His hepatitis B was negative. He tells me that he did not keep his appointment with Dr. Marcelene Butte.  Lung discussion with the patient today about the risks of complications from hepatitis C including cancer of the liver as well as cirrhosis and death. I advised him that he should be evaluated, he tells me he would like to be seen at [facility].

## 2010-11-09 NOTE — Assessment & Plan Note (Addendum)
I suspect that his foot pain and numbness is related to his hip surgery as this started immediately following surgery. Will check an MRI of the lumbar spine to exclude any lumbar disc disease. Trial of gabapentin for pain.

## 2010-11-09 NOTE — Patient Instructions (Addendum)
You will be contacted about your referral to the hepatitis.  Please follow up in 1 month.
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
## 2010-11-09 NOTE — Telephone Encounter (Signed)
Please call patient and let him know that I would like for him to complete an MRI of his lumbar spine to make sure that there is not something going on in his back which is contributing to the pain in his right foot and numbness.

## 2010-11-10 NOTE — Telephone Encounter (Signed)
Advised pt. He states he has had 2 MRIs done recently at Dr Wadie Lessen office. Called [phone] and requested MRI results, they will fax results to [place].

## 2010-11-11 NOTE — Telephone Encounter (Signed)
I reviewed the MRI report and see that he has some disc problems in his spine and that there is some nerve compression. This may be contributing to his right foot numbness.  I would like him to see a spine specialist.  Myriam Jacobson will call him.

## 2010-11-11 NOTE — Telephone Encounter (Signed)
Results received and forwarded to Provider for review.  Please advise. 

## 2010-11-11 NOTE — Telephone Encounter (Signed)
Pt.notified

## 2010-12-14 ENCOUNTER — Ambulatory Visit (INDEPENDENT_AMBULATORY_CARE_PROVIDER_SITE_OTHER): Payer: Medicare Other | Admitting: Family

## 2010-12-14 ENCOUNTER — Encounter: Payer: Self-pay | Admitting: Family

## 2010-12-14 DIAGNOSIS — M25559 Pain in unspecified hip: Secondary | ICD-10-CM

## 2010-12-14 DIAGNOSIS — M79671 Pain in right foot: Secondary | ICD-10-CM

## 2010-12-14 DIAGNOSIS — M79609 Pain in unspecified limb: Secondary | ICD-10-CM

## 2010-12-14 NOTE — Assessment & Plan Note (Signed)
Notes improvement in burning pain with the addition of gabapentin.  He continues evaluation with ortho and neurosurgery.  Will try to get most recent office notes.

## 2010-12-14 NOTE — Assessment & Plan Note (Signed)
I stressed with him today the importance of following through with his referral to GI. Pt verbalized understanding.

## 2010-12-14 NOTE — Patient Instructions (Signed)
Follow up in 3 months
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
## 2011-02-16 ENCOUNTER — Telehealth: Payer: Self-pay | Admitting: Family

## 2011-02-16 NOTE — Telephone Encounter (Signed)
Please send refill of gabapentin to [facility] on [address] in [address]

## 2011-02-17 MED ORDER — GABAPENTIN 100 MG PO CAPS: 100.0000 mg | ORAL_CAPSULE | Freq: Three times a day (TID) | ORAL | Status: DC

## 2011-02-17 NOTE — Telephone Encounter (Signed)
Refill sent to pharmacy.   

## 2011-03-01 ENCOUNTER — Encounter: Payer: Self-pay | Admitting: Family

## 2011-03-13 ENCOUNTER — Encounter: Payer: Self-pay | Admitting: Family

## 2011-03-13 ENCOUNTER — Ambulatory Visit (INDEPENDENT_AMBULATORY_CARE_PROVIDER_SITE_OTHER): Payer: Medicare Other | Admitting: Family

## 2011-03-13 VITALS — BP 140/86 | HR 84 | Temp 97.8°F | Resp 16 | Ht 68.0 in | Wt 153.0 lb

## 2011-03-13 DIAGNOSIS — M21371 Foot drop, right foot: Secondary | ICD-10-CM

## 2011-03-13 DIAGNOSIS — M216X9 Other acquired deformities of unspecified foot: Secondary | ICD-10-CM

## 2011-03-13 DIAGNOSIS — M1990 Unspecified osteoarthritis, unspecified site: Secondary | ICD-10-CM | POA: Insufficient documentation

## 2011-03-13 DIAGNOSIS — B1710 Acute hepatitis C without hepatic coma: Secondary | ICD-10-CM

## 2011-03-13 NOTE — Assessment & Plan Note (Signed)
This is being managed by [facility].  He is scheduled for a liver biopsy.
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
## 2011-03-13 NOTE — Patient Instructions (Signed)
Please follow up in 3-4 months. You will be contacted about your referral to the physical therapist. Keep your upcoming apt for your liver biopsy at [facility].

## 2011-03-13 NOTE — Assessment & Plan Note (Addendum)
Will refer to PT for therapy.  I have advised him to bring rx for  Orthotics with him to PT.    Randy [name] is unable to use a walker as he has trouble weight bearing and lifting right foot due to foot drop and numbness.

## 2011-03-14 ENCOUNTER — Telehealth: Payer: Self-pay | Admitting: *Deleted

## 2011-03-14 NOTE — Telephone Encounter (Signed)
Rx and 03/13/11 office note faxed to [facility] at [phone]  Ph)[phone] for power wheelchair.

## 2011-03-14 NOTE — Telephone Encounter (Signed)
Received message from Kateri Plummer @ [facility] stating they received our referral for home health services but needs the ICD9. Per Sandford Craze, NP dx code should be for 715.90. Attempted to reach Viviann Spare and left message on voicemail to return my call.

## 2011-03-16 NOTE — Telephone Encounter (Signed)
Spoke to [place], she reports that Viviann Spare is out of the office. Dx code given to [place].

## 2011-03-21 ENCOUNTER — Telehealth: Payer: Self-pay | Admitting: *Deleted

## 2011-03-21 NOTE — Telephone Encounter (Signed)
Received message from [facility] wanting to verify Provider's address and fax. Spoke to [place] and verified that pt is requesting a vacuum pump for erectile dysfunction.  She wanted to know what the turnaround time would be on the order form. I advised her it would depend based on recent discussion / evaluation of pt in the office. Information was provided. Awaiting receipt of form.

## 2011-03-22 NOTE — Telephone Encounter (Signed)
Received voice message from Randy [name] stating he received a diagnosis code on one of our patients but did not get the name and he does not remember who he called [place] about. Called and spoke to Mr Randy [name]. And gave information again. Pt had left voice message requesting status of home health referral. Notified pt that he should be contacted soon by home health as they have received our referral.

## 2011-03-24 NOTE — Telephone Encounter (Signed)
Called pt re: verification of request for vacuum therapy system. Call was disconnected and busy signal was reached when I tried to redial the pt's number.

## 2011-03-24 NOTE — Telephone Encounter (Signed)
Pt called back stating he would like to discuss treatment for ED. Scheduled pt appt to see Melissa on 03/29/11 at 11am.

## 2011-03-29 ENCOUNTER — Encounter: Payer: Self-pay | Admitting: Family

## 2011-03-29 ENCOUNTER — Ambulatory Visit (INDEPENDENT_AMBULATORY_CARE_PROVIDER_SITE_OTHER): Payer: Medicare Other | Admitting: Family

## 2011-03-29 ENCOUNTER — Telehealth: Payer: Self-pay | Admitting: *Deleted

## 2011-03-29 VITALS — BP 140/82 | HR 90 | Temp 98.7°F | Resp 16 | Ht 68.0 in | Wt 151.1 lb

## 2011-03-29 DIAGNOSIS — M1990 Unspecified osteoarthritis, unspecified site: Secondary | ICD-10-CM

## 2011-03-29 DIAGNOSIS — N529 Male erectile dysfunction, unspecified: Secondary | ICD-10-CM | POA: Insufficient documentation

## 2011-03-29 NOTE — Telephone Encounter (Signed)
Received denial from Medicaid for in-home health services as "pt not seen in the last 90 days"? Letter states we may provide pt with another referral for services if circumstances change. Pt was seen within the last 90 days however I believe there were delays with form processing that contributed to the denial from Medicaid. Can we refer pt to another home health agency (Not [facility])?

## 2011-03-29 NOTE — Assessment & Plan Note (Signed)
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  Will check serum testosterone level. Paperwork completed for vaccuum device.

## 2011-03-29 NOTE — Patient Instructions (Signed)
Follow up in 3 months

## 2011-03-29 NOTE — Progress Notes (Signed)
  Subjective:    Patient ID: Randy [name], male    DOB: 10-24-1950, 60 y.o.   MRN: [id number]  HPI  Mr.  Gienger is a 60 yr old male who presents today to discuss erectile dysfunction.  He reports that he has only been able to achieve an erection once in the last 1 year.  Previously, he tried viagra and suffered a 2 day episode of priapism.  He is requesting that paperwork be completed for a vacuum Erection Device.     Review of Systems     Objective:   Physical Exam  Constitutional: He appears well-developed and well-nourished.  Cardiovascular: Normal rate and regular rhythm.   No murmur heard. Pulmonary/Chest: Effort normal and breath sounds normal. No respiratory distress. He has no wheezes. He has no rales. He exhibits no tenderness.  Skin: Skin is warm and dry.  Psychiatric: He has a normal mood and affect. His behavior is normal. Judgment and thought content normal.          Assessment & Plan:

## 2011-03-30 ENCOUNTER — Encounter: Payer: Self-pay | Admitting: Family

## 2011-03-30 LAB — TESTOSTERONE, FREE, TOTAL, SHBG
Testosterone, Free: 59.6 pg/mL (ref 47.0–244.0)
Testosterone-% Free: 0.7 % — ABNORMAL LOW (ref 1.6–2.9)

## 2011-03-31 NOTE — Telephone Encounter (Signed)
Notified pt that we are trying to obtain new home health referral to another agency and hope to contact him next week with details. Pt states he is going to proceed with referral to [facility] at [facility]. Please advised re: home health referral.

## 2011-05-31 ENCOUNTER — Telehealth: Payer: Self-pay | Admitting: Family

## 2011-05-31 NOTE — Telephone Encounter (Signed)
Patient states that his legs are hurting again and would like hydrocodone called in to [facility] on [address] in [place]

## 2011-05-31 NOTE — Telephone Encounter (Signed)
Spoke with pt and advised him that we have never prescribed this for him before. He states he has been getting it from his neurosurgeon that he will be following up with next month. Advised pt to contact neurosurgeon for refill and he voices understanding.

## 2011-06-22 ENCOUNTER — Telehealth: Payer: Self-pay | Admitting: *Deleted

## 2011-06-22 NOTE — Telephone Encounter (Signed)
Received fax from [facility] that pt is requesting assistance through their services. Pt has completed Home Health with [facility] and per the [facility] pt had met his PT goals. OT did an assessment and did not identify any needs within pt's home.  Attempted to reach pt and left message for pt to return my call re: ?needs for additional home health services.

## 2011-06-27 ENCOUNTER — Encounter: Payer: Self-pay | Admitting: Family

## 2011-06-27 ENCOUNTER — Ambulatory Visit (INDEPENDENT_AMBULATORY_CARE_PROVIDER_SITE_OTHER): Payer: Medicare Other | Admitting: Family

## 2011-06-27 DIAGNOSIS — M21371 Foot drop, right foot: Secondary | ICD-10-CM

## 2011-06-27 DIAGNOSIS — M216X9 Other acquired deformities of unspecified foot: Secondary | ICD-10-CM

## 2011-06-27 DIAGNOSIS — N529 Male erectile dysfunction, unspecified: Secondary | ICD-10-CM

## 2011-06-27 MED ORDER — HYDROCODONE-ACETAMINOPHEN 5-325 MG PO TABS: ORAL_TABLET | ORAL | Status: DC

## 2011-06-27 NOTE — Telephone Encounter (Signed)
Pt was seen in the office and discussed concerns with Sandford Craze, NP. Pt wishes to hold off on Home Health at this time.

## 2011-06-27 NOTE — Patient Instructions (Signed)
Please follow up in 3 months.  

## 2011-06-27 NOTE — Assessment & Plan Note (Signed)
He is doing better now that he has the orthotic and his motorized wheel chair.  He plans to start physical on the second floor.  He is requesting something for pain during physical therapy.  I have given him a prescription for vicodin with no refills and had him sign a controlled substance contract.
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
## 2011-06-27 NOTE — Assessment & Plan Note (Signed)
Unchanged. He tells me that his focus right now is on his right hip/leg issues.  Testosterone was normal last visit.

## 2011-06-28 ENCOUNTER — Ambulatory Visit: Payer: Medicare Other | Admitting: Family

## 2011-06-28 NOTE — Telephone Encounter (Signed)
Correction to previous documentation, pt will proceed with Home Health. Form completed for [facility] and faxed to [phone].

## 2011-07-05 ENCOUNTER — Ambulatory Visit: Payer: Medicare Other | Attending: Neurology | Admitting: Physical Therapy

## 2011-07-05 DIAGNOSIS — IMO0001 Reserved for inherently not codable concepts without codable children: Secondary | ICD-10-CM | POA: Insufficient documentation

## 2011-07-05 DIAGNOSIS — M6281 Muscle weakness (generalized): Secondary | ICD-10-CM | POA: Insufficient documentation

## 2011-07-05 DIAGNOSIS — M25579 Pain in unspecified ankle and joints of unspecified foot: Secondary | ICD-10-CM | POA: Insufficient documentation

## 2011-07-05 DIAGNOSIS — R262 Difficulty in walking, not elsewhere classified: Secondary | ICD-10-CM | POA: Insufficient documentation

## 2011-07-11 ENCOUNTER — Emergency Department (HOSPITAL_BASED_OUTPATIENT_CLINIC_OR_DEPARTMENT_OTHER)
Admission: EM | Admit: 2011-07-11 | Discharge: 2011-07-11 | Disposition: A | Discharge status: Home / Self Care | Payer: Medicare Other | Attending: Emergency Medicine | Admitting: Emergency Medicine

## 2011-07-11 ENCOUNTER — Ambulatory Visit: Payer: Medicare Other | Admitting: Rehabilitation

## 2011-07-11 ENCOUNTER — Encounter (HOSPITAL_BASED_OUTPATIENT_CLINIC_OR_DEPARTMENT_OTHER): Payer: Self-pay | Admitting: Family Medicine

## 2011-07-11 DIAGNOSIS — M79609 Pain in unspecified limb: Secondary | ICD-10-CM | POA: Insufficient documentation

## 2011-07-11 DIAGNOSIS — M79673 Pain in unspecified foot: Secondary | ICD-10-CM

## 2011-07-11 DIAGNOSIS — G8929 Other chronic pain: Secondary | ICD-10-CM | POA: Insufficient documentation

## 2011-07-11 MED ORDER — HYDROCODONE-ACETAMINOPHEN 5-325 MG PO TABS
2.0000 | ORAL_TABLET | Freq: Once | ORAL | Status: AC
  Administered 2011-07-11: 2 via ORAL
  Filled 2011-07-11: qty 2

## 2011-07-11 MED ORDER — HYDROCODONE-ACETAMINOPHEN 5-325 MG PO TABS: ORAL_TABLET | ORAL | Status: DC

## 2011-07-11 NOTE — Discharge Instructions (Signed)
Chronic Pain °Chronic pain can be defined as pain that is lasting, off and on, and lasts for 3 to 6 months or longer. Many things cause chronic pain, which can make it difficult to make a discrete diagnosis. There are many treatment options available for chronic pain. However, finding a treatment that works well for you may require trying various approaches until a suitable one is found. °CAUSES  °In some types of chronic medical conditions, the pain is caused by a normal pain response within the body. A normal pain response helps the body identify illness or injury and prevent further damage from being done. In these cases, the cause of the pain may be identified and treated, even if it may not be cured completely. Examples of chronic conditions which can cause chronic pain include: °· Inflammation of the joints (arthritis).  °· Back pain or neck pain (including bulging or herniated disks).  °· Migraine headaches.  °· Cancer.  °In some other types of chronic pain syndromes, the pain is caused by an abnormal pain response within the body. An abnormal pain response is present when there is no ongoing cause (or stimulus) for the pain, or when the cause of the pain is arising from the nerves or nervous system itself. Examples of conditions which can cause chronic pain due to an abnormal pain response include: °· Fibromyalgia.  °· Reflex sympathetic dystrophy (RSD).  °· Neuropathy (when the nerves themselves are damaged, and may cause pain).  °DIAGNOSIS  °Your caregiver will help diagnose your condition over time. In many cases, the initial focus will be on excluding conditions that could be causing the pain. Depending on your symptoms, your caregiver may order some tests to diagnose your condition. Some of these tests include: °· Blood tests.  °· Computerized X-ray scans (CT scan).  °· Computerized magnetic scans (MRI).  °· X-rays.  °· Ultrasounds.  °· Nerve conduction studies.  °· Consultation with other physicians or  specialists.  °TREATMENT  °There are many treatment options for people suffering from chronic pain. Finding a treatment that works well may take time.  °· You may be referred to a pain management specialist.  °· You may be put on medication to help with the pain. Unfortunately, some medications (such as opiate medications) may not be very effective in cases where chronic pain is due to abnormal pain responses. Finding the right medications can take some time.  °· Adjunctive therapies may be used to provide additional relief and improve a patient's quality of life. These therapies include:  °· Mindfulness meditation.  °· Acupuncture.  °· Biofeedback.  °· Cognitive-behavioral therapy.  °· In certain cases, surgical interventions may be attempted.  °HOME CARE INSTRUCTIONS  °· Make sure you understand these instructions prior to discharge.  °· Ask any questions and share any further concerns you have with your caregiver prior to discharge.  °· Take all medications as directed by your caregiver.  °· Keep all follow-up appointments.  °SEEK MEDICAL CARE IF:  °· Your pain gets worse.  °· You develop a new pain that was not present before.  °· You cannot tolerate any medications prescribed by your caregiver.  °· You develop new symptoms since your last visit with your caregiver.  °SEEK IMMEDIATE MEDICAL CARE IF:  °· You develop muscular weakness.  °· You have decreased sensation or numbness.  °· You lose control of bowel or bladder function.  °· Your pain suddenly gets much worse.  °· You have an oral   temperature above 102° F (38.9° C), not controlled by medication.  °· You develop shaking chills, confusion, chest pain, or shortness of breath.  °Document Released: 02/04/2002 Document Revised: 01/25/2011 Document Reviewed: 05/13/2008 °Exit[employer]® Patient Information ©2012 [employer]. °

## 2011-07-11 NOTE — ED Provider Notes (Signed)
Medical screening examination/treatment/procedure(s) were performed by non-physician practitioner and as supervising physician I was immediately available for consultation/collaboration.   Mazin Emma, MD 07/11/11 1547 

## 2011-07-11 NOTE — ED Notes (Signed)
Pt sts he was upstairs doing "therapy for foot". Pt sts he had a total hip replacement 1 year ago and "it messed up my nerve and hurts all the way up to my hip". Pt sts he had pain with therapy today and was told to come here. Pt ambulatory on crutches.
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
## 2011-07-11 NOTE — ED Notes (Signed)
Called pt into triage room and pt walked to telephone and sts "I need to call someone so give me just a minute". Pt asked to return to lobby to use public phone and would be called again once pt is ready to be triaged.

## 2011-07-13 ENCOUNTER — Ambulatory Visit: Payer: Medicare Other | Admitting: Rehabilitation

## 2011-07-18 ENCOUNTER — Ambulatory Visit: Payer: Medicare Other | Admitting: Rehabilitation

## 2011-07-20 ENCOUNTER — Ambulatory Visit: Payer: Medicare Other | Admitting: Rehabilitation

## 2011-07-25 ENCOUNTER — Ambulatory Visit: Payer: Medicare Other | Admitting: Rehabilitation

## 2011-07-27 ENCOUNTER — Ambulatory Visit: Payer: Medicare Other | Admitting: Rehabilitation

## 2011-08-01 ENCOUNTER — Ambulatory Visit: Payer: Medicare Other | Attending: Neurology | Admitting: Physical Therapy

## 2011-08-01 DIAGNOSIS — M25579 Pain in unspecified ankle and joints of unspecified foot: Secondary | ICD-10-CM | POA: Insufficient documentation

## 2011-08-01 DIAGNOSIS — M6281 Muscle weakness (generalized): Secondary | ICD-10-CM | POA: Insufficient documentation

## 2011-08-01 DIAGNOSIS — R262 Difficulty in walking, not elsewhere classified: Secondary | ICD-10-CM | POA: Insufficient documentation

## 2011-08-01 DIAGNOSIS — IMO0001 Reserved for inherently not codable concepts without codable children: Secondary | ICD-10-CM | POA: Insufficient documentation

## 2011-08-03 ENCOUNTER — Ambulatory Visit: Payer: Medicare Other | Admitting: Rehabilitation

## 2011-08-07 IMAGING — CR DG HIP COMPLETE 2+V*R*
4 series · 4 of 4 positions shown · non-contrast
Comparison: Pelvis radiograph 06/13/2010

CLINICAL DATA: Right leg pain and weakness.  Recent hip replacement
surgery.

RIGHT HIP - COMPLETE 2+ VIEW

[t pelvis a.p.]
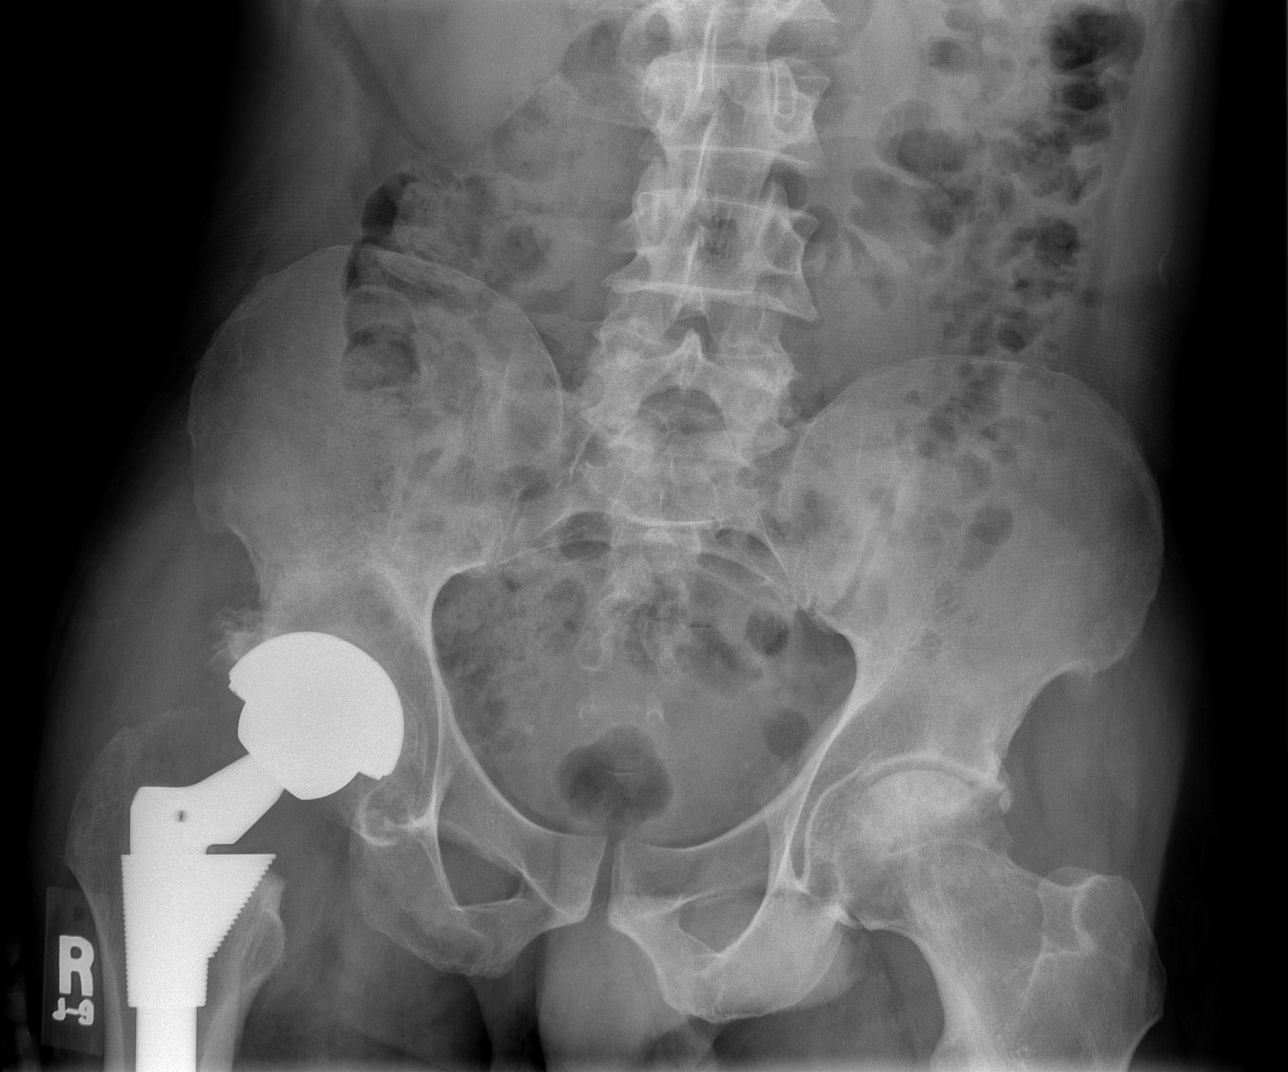

[t hip ap right]
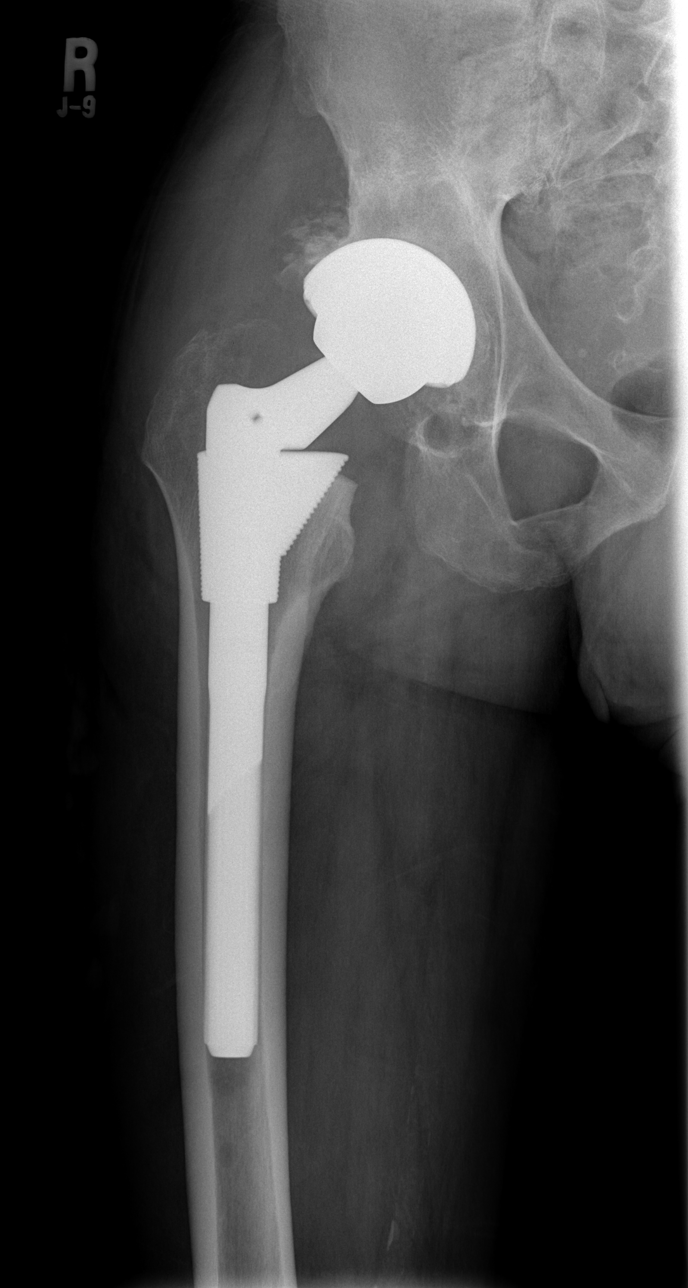

[t hip frog leg right (1 of 2)]
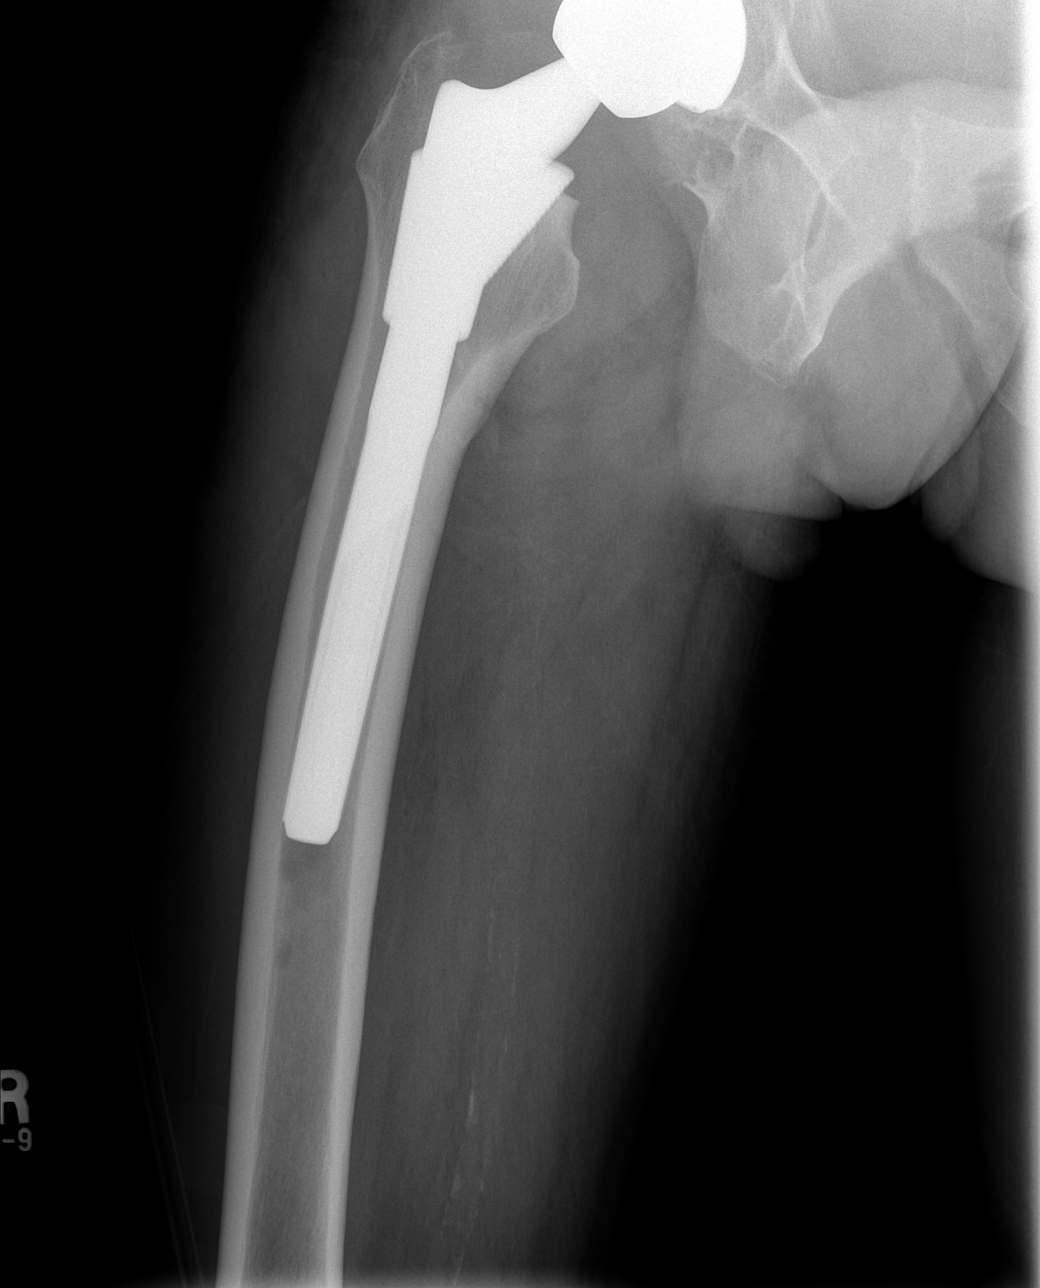

[t hip frog leg right (2 of 2)]
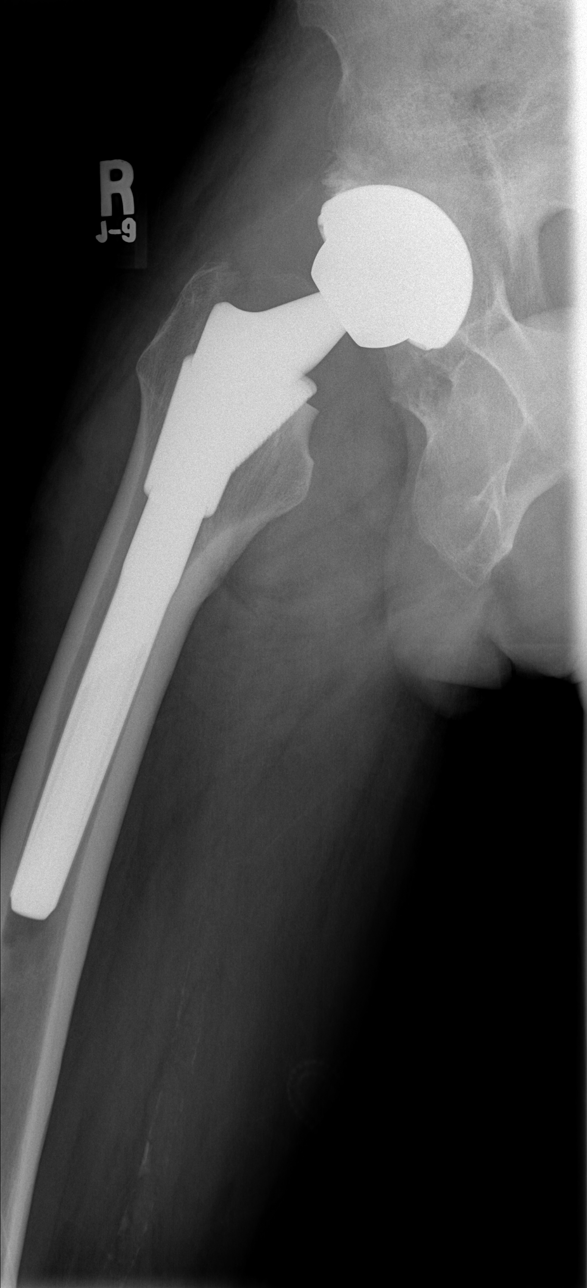

[4 of 4 positions shown; findings below may reference images not displayed]

FINDINGS: The right hip prosthesis appears stable and unchanged.
No complicating features.  No fracture.  No dislocation.
IMPRESSION: No acute bony findings.  Stable position and appearance of the
right hip prosthesis.

## 2011-08-08 ENCOUNTER — Telehealth: Payer: Self-pay | Admitting: *Deleted

## 2011-08-08 ENCOUNTER — Ambulatory Visit: Payer: Medicare Other | Admitting: Rehabilitation

## 2011-08-08 NOTE — Telephone Encounter (Signed)
Received fax from [facility] and [employer] requesting form completion for personal care service approval. Form forwarded to Provider for completion.  Completed form should be faxed back to [facility] at [phone].

## 2011-08-09 NOTE — Telephone Encounter (Signed)
Form faxed to number below.

## 2011-08-09 NOTE — Telephone Encounter (Signed)
Form completed.

## 2011-08-10 ENCOUNTER — Ambulatory Visit: Payer: Medicare Other | Admitting: Physical Therapy

## 2011-08-15 ENCOUNTER — Ambulatory Visit: Payer: Medicare Other | Admitting: Physical Therapy

## 2011-08-15 ENCOUNTER — Telehealth: Payer: Self-pay | Admitting: Family

## 2011-08-15 MED ORDER — HYDROCODONE-ACETAMINOPHEN 5-325 MG PO TABS: ORAL_TABLET | ORAL | Status: DC

## 2011-08-15 NOTE — Telephone Encounter (Signed)
Pls let pt know that I have sent rx for vicocin to his pharmacy for him to use prior to PT.

## 2011-08-15 NOTE — Telephone Encounter (Addendum)
The information that I have gotten back from neurology is that there seems to be some nerve damage.  His hip has arthritis. I would recommend that he follow back up with Dr. Cloretta Ned at [facility] for further evaluation and recommendations.

## 2011-08-15 NOTE — Telephone Encounter (Signed)
Notified pt.  Pt is requesting referral to "someone that can tell him what is wrong with his hip". States he has seen neurologists in the past and they have told him they can't find anything wrong with his hip. He states is wanting to find the cause of his problem. Please advise.

## 2011-08-17 ENCOUNTER — Ambulatory Visit: Payer: Medicare Other | Admitting: Rehabilitation

## 2011-08-17 NOTE — Telephone Encounter (Signed)
Notified pt and he voices understanding. He will contact Dr Cloretta Ned for follow up.

## 2011-08-22 ENCOUNTER — Ambulatory Visit: Payer: Medicare Other | Admitting: Rehabilitation

## 2011-08-24 ENCOUNTER — Ambulatory Visit: Payer: Medicare Other | Admitting: Physical Therapy

## 2011-08-28 ENCOUNTER — Telehealth: Payer: Self-pay | Admitting: *Deleted

## 2011-08-28 DIAGNOSIS — M21371 Foot drop, right foot: Secondary | ICD-10-CM

## 2011-08-28 NOTE — Telephone Encounter (Signed)
Received call from pt stating he wants to see someone for a 2nd opinion re: the nerve damage in his foot. He reports that PT has been helping but he still can't move his toes. Pt  States he does not want to go back to Dr Cloretta Ned and Dr Timoteo Ace as they told him there was nothing else they could do for him.  Please advise.

## 2011-08-28 NOTE — Telephone Encounter (Signed)
Left message on machine to return my call. 

## 2011-08-28 NOTE — Telephone Encounter (Signed)
Pls call pt and let him know that I will work on trying to get him set up for a second opinion.

## 2011-08-29 ENCOUNTER — Ambulatory Visit: Payer: Medicare Other | Attending: Neurology | Admitting: Rehabilitation

## 2011-08-29 DIAGNOSIS — M25579 Pain in unspecified ankle and joints of unspecified foot: Secondary | ICD-10-CM | POA: Insufficient documentation

## 2011-08-29 DIAGNOSIS — IMO0001 Reserved for inherently not codable concepts without codable children: Secondary | ICD-10-CM | POA: Insufficient documentation

## 2011-08-29 DIAGNOSIS — M6281 Muscle weakness (generalized): Secondary | ICD-10-CM | POA: Insufficient documentation

## 2011-08-29 DIAGNOSIS — R262 Difficulty in walking, not elsewhere classified: Secondary | ICD-10-CM | POA: Insufficient documentation

## 2011-08-29 NOTE — Telephone Encounter (Signed)
Notified pt. 

## 2011-08-31 ENCOUNTER — Encounter: Payer: Self-pay | Admitting: Neurology

## 2011-08-31 ENCOUNTER — Ambulatory Visit: Payer: Medicare Other | Admitting: Physical Therapy

## 2011-09-05 ENCOUNTER — Ambulatory Visit: Payer: Medicare Other | Admitting: Rehabilitation

## 2011-09-07 ENCOUNTER — Ambulatory Visit: Payer: Medicare Other | Admitting: Rehabilitation

## 2011-09-12 ENCOUNTER — Ambulatory Visit: Payer: Medicare Other | Admitting: Physical Therapy

## 2011-09-14 ENCOUNTER — Ambulatory Visit: Payer: Medicare Other | Admitting: Rehabilitation

## 2011-09-19 ENCOUNTER — Ambulatory Visit: Payer: Medicare Other | Admitting: Rehabilitation

## 2011-09-21 ENCOUNTER — Ambulatory Visit: Payer: Medicare Other | Admitting: Rehabilitation

## 2011-09-25 ENCOUNTER — Ambulatory Visit (INDEPENDENT_AMBULATORY_CARE_PROVIDER_SITE_OTHER): Payer: Medicare Other | Admitting: Family

## 2011-09-25 ENCOUNTER — Encounter: Payer: Self-pay | Admitting: Family

## 2011-09-25 VITALS — BP 124/80 | HR 72 | Temp 97.7°F | Ht 68.0 in | Wt 154.0 lb

## 2011-09-25 DIAGNOSIS — M216X9 Other acquired deformities of unspecified foot: Secondary | ICD-10-CM

## 2011-09-25 DIAGNOSIS — M21371 Foot drop, right foot: Secondary | ICD-10-CM

## 2011-09-25 MED ORDER — HYDROCODONE-ACETAMINOPHEN 5-325 MG PO TABS: ORAL_TABLET | ORAL | Status: DC

## 2011-09-25 MED ORDER — GABAPENTIN 300 MG PO CAPS: 300.0000 mg | ORAL_CAPSULE | Freq: Three times a day (TID) | ORAL | Status: DC | PRN
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
## 2011-09-25 NOTE — Patient Instructions (Signed)
Please schedule a fasting medicare wellness visit.

## 2011-09-25 NOTE — Assessment & Plan Note (Signed)
He continues PT with good progress in his function.  Refill was provided today for gabapentin and rx printed and give to the patient for vicodin to be used prior to PT sessions.

## 2011-09-26 ENCOUNTER — Ambulatory Visit: Payer: Medicare Other | Admitting: Physical Therapy

## 2011-09-28 ENCOUNTER — Ambulatory Visit: Payer: Medicare Other | Attending: Neurology | Admitting: Physical Therapy

## 2011-09-28 DIAGNOSIS — M6281 Muscle weakness (generalized): Secondary | ICD-10-CM | POA: Insufficient documentation

## 2011-09-28 DIAGNOSIS — R262 Difficulty in walking, not elsewhere classified: Secondary | ICD-10-CM | POA: Insufficient documentation

## 2011-09-28 DIAGNOSIS — M25579 Pain in unspecified ankle and joints of unspecified foot: Secondary | ICD-10-CM | POA: Insufficient documentation

## 2011-09-28 DIAGNOSIS — IMO0001 Reserved for inherently not codable concepts without codable children: Secondary | ICD-10-CM | POA: Insufficient documentation

## 2011-10-03 ENCOUNTER — Encounter: Payer: Self-pay | Admitting: Family

## 2011-10-03 ENCOUNTER — Ambulatory Visit (INDEPENDENT_AMBULATORY_CARE_PROVIDER_SITE_OTHER): Payer: Medicare Other | Admitting: Family

## 2011-10-03 ENCOUNTER — Ambulatory Visit: Payer: Medicare Other | Admitting: Rehabilitation

## 2011-10-03 VITALS — BP 126/80 | HR 63 | Temp 98.3°F | Resp 16 | Ht 69.0 in | Wt 157.0 lb

## 2011-10-03 DIAGNOSIS — Z23 Encounter for immunization: Secondary | ICD-10-CM

## 2011-10-03 DIAGNOSIS — B1710 Acute hepatitis C without hepatic coma: Secondary | ICD-10-CM

## 2011-10-03 DIAGNOSIS — Z136 Encounter for screening for cardiovascular disorders: Secondary | ICD-10-CM

## 2011-10-03 DIAGNOSIS — H547 Unspecified visual loss: Secondary | ICD-10-CM

## 2011-10-03 DIAGNOSIS — B351 Tinea unguium: Secondary | ICD-10-CM | POA: Insufficient documentation

## 2011-10-03 DIAGNOSIS — M21371 Foot drop, right foot: Secondary | ICD-10-CM

## 2011-10-03 DIAGNOSIS — N529 Male erectile dysfunction, unspecified: Secondary | ICD-10-CM

## 2011-10-03 DIAGNOSIS — Z125 Encounter for screening for malignant neoplasm of prostate: Secondary | ICD-10-CM

## 2011-10-03 DIAGNOSIS — B1920 Unspecified viral hepatitis C without hepatic coma: Secondary | ICD-10-CM

## 2011-10-03 DIAGNOSIS — D649 Anemia, unspecified: Secondary | ICD-10-CM

## 2011-10-03 DIAGNOSIS — I10 Essential (primary) hypertension: Secondary | ICD-10-CM

## 2011-10-03 LAB — CBC WITH DIFFERENTIAL/PLATELET
Basophils Absolute: 0 10*3/uL (ref 0.0–0.1)
Basophils Relative: 1 % (ref 0–1)
Eosinophils Absolute: 0.1 10*3/uL (ref 0.0–0.7)
Eosinophils Relative: 1 % (ref 0–5)
HCT: 43.4 % (ref 39.0–52.0)
Lymphocytes Relative: 38 % (ref 12–46)
MCHC: 35.3 g/dL (ref 30.0–36.0)
MCV: 85.9 fL (ref 78.0–100.0)
Monocytes Absolute: 0.5 10*3/uL (ref 0.1–1.0)
Platelets: 175 10*3/uL (ref 150–400)
RDW: 13.8 % (ref 11.5–15.5)
WBC: 4.2 10*3/uL (ref 4.0–10.5)

## 2011-10-03 LAB — BASIC METABOLIC PANEL WITH GFR
CO2: 26 mEq/L (ref 19–32)
Calcium: 9.6 mg/dL (ref 8.4–10.5)
Chloride: 107 mEq/L (ref 96–112)
Creat: 0.92 mg/dL (ref 0.50–1.35)
Glucose, Bld: 83 mg/dL (ref 70–99)
Sodium: 140 mEq/L (ref 135–145)

## 2011-10-03 LAB — HEPATIC FUNCTION PANEL
ALT: 35 U/L (ref 0–53)
AST: 34 U/L (ref 0–37)
Alkaline Phosphatase: 67 U/L (ref 39–117)
Bilirubin, Direct: 0.2 mg/dL (ref 0.0–0.3)

## 2011-10-03 MED ORDER — CEPHALEXIN 500 MG PO CAPS: 500.0000 mg | ORAL_CAPSULE | Freq: Two times a day (BID) | ORAL | Status: AC

## 2011-10-03 NOTE — Assessment & Plan Note (Signed)
Improving with PT.  He is scheduled for a consultation with Dr. Modesto Charon. Continue PT.

## 2011-10-03 NOTE — Patient Instructions (Signed)
Please return in 1 month for second twinrix vaccine. Follow up in 6 months for a regular follow up visit. Complete your blood work prior to leaving.

## 2011-10-03 NOTE — Assessment & Plan Note (Signed)
Stable.  Monitor.  

## 2011-10-03 NOTE — Assessment & Plan Note (Signed)
R toenail has fallen off.  Plan to treat with empiric keflex to prevent infection.  Pt could benefit from lamisil for his toenails, but I would like more info on his Hepatitis C status first.

## 2011-10-03 NOTE — Assessment & Plan Note (Signed)
Will request records from Hepatology.  Pt tells me that they told him "I don't have it."

## 2011-10-03 NOTE — Progress Notes (Signed)
Subjective:    Patient ID: Randy [name], male    DOB: 1950-07-18, 61 y.o.   MRN: [id number]  HPI  Subjective:   Patient here for Medicare annual wellness visit and management of other chronic and acute problems.  Hepatitis C-he is followed at [facility].    ED- Reports that this is "ok."  Not currently sexually active.  R foot drop- He continues to improve with PT, starting to get some movement back in the right foot and is able to weight bear on the right foot.   Tobacco abuse- down to 1 pack a month.  Wants to quit completely.   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   Risk factors: at risk for CAD  Roster of Physicians Providing Medical Care to Patient: Dr. Keturah Barre hepatology Dr. Modesto Charon- neurology  Activities of Daily Living  In your present state of health, do you have any difficulty performing the following activities? Preparing food and eating?: yes Bathing yourself: yes Getting dressed: yes Using the toilet:No  Moving around from place to place: yes In the past year have you fallen or had a near fall?:yes 3 months ago, tried to walk with a cane.    Home Safety: Has smoke detector and wears seat belts. No firearms. No excess sun exposure.  Diet and Exercise  Current exercise habits: none Dietary issues discussed: healthy diet   Depression Screen  (Note: if answer to either of the following is "Yes", then a more complete depression screening is indicated)  Q1: Over the past two weeks, have you felt down, depressed or hopeless?no  Q2: Over the past two weeks, have you felt little interest or pleasure in doing things? no   The following portions of the patient's history were reviewed and updated as appropriate: allergies, current medications, past family history, past medical history, past social history, past surgical history and problem list.   Objective:   Vision:  Wears glasses- requests referral to  opthalmology Hearing: able to hear forced whisper at 6 feet Body mass index: see HPI Cognitive Impairment Assessment: cognition, memory and judgment appear normal.   Assessment:   Medicare wellness utd on preventive parameters   Last colo 3 yrs ago- recommended 10 yr follow up.  ([place])     Plan:    During the course of the visit the patient was educated and counseled about appropriate screening and preventive services including:       Fall prevention       Nutrition counseling   Vaccines / LABS  Start twinrix series. Obtain labs which will include PSA  Patient Instructions (the written plan) was given to the patient.       Review of Systems  Constitutional: Positive for unexpected weight change.  HENT: Negative for hearing loss and congestion.   Eyes: Negative for visual disturbance.  Respiratory: Negative for cough.   Cardiovascular: Negative for chest pain and palpitations.  Gastrointestinal: Negative for nausea, vomiting and diarrhea.  Genitourinary: Negative for dysuria.       Denies ED  Musculoskeletal: Negative for myalgias and arthralgias.  Skin: Negative for rash.  Neurological: Negative for headaches.  Hematological: Negative for adenopathy.  Psychiatric/Behavioral:       Denies depression       Objective:   Physical Exam  Physical Exam  Constitutional: He is oriented to person, place, and time. He appears well-developed and well-nourished. No distress.  HENT:  Head: Normocephalic and  atraumatic.  Right Ear: Tympanic membrane and ear canal normal.  Left Ear: Tympanic membrane and ear canal normal.  Mouth/Throat: Oropharynx is clear and moist.  Eyes: Pupils are equal, round, and reactive to light. No scleral icterus.  Neck: Normal range of motion. No thyromegaly present.  Cardiovascular: Normal rate and regular rhythm.   No murmur heard. Pulmonary/Chest: Effort normal and breath sounds normal. No respiratory distress. He has no wheezes. He  has no rales. He exhibits no tenderness.  Abdominal: Soft. Bowel sounds are normal. He exhibits no distension and no mass. There is no tenderness. There is no rebound and no guarding.  Musculoskeletal: He exhibits no edema.  Lymphadenopathy:    He has no cervical adenopathy.  Neurological: He is alert and oriented to person, place, and time. R foot drop is noted. Skin: Skin is noted to be very dry.  Toenails are thickened and discolored.  R small toenail is missing.  Skin is open/red moist base with some serous drainage on sock. Psychiatric: He has a normal mood and affect. His behavior is normal. Judgment and thought content normal.          Assessment & Plan:         Assessment & Plan:

## 2011-10-04 ENCOUNTER — Telehealth: Payer: Self-pay | Admitting: *Deleted

## 2011-10-04 ENCOUNTER — Encounter: Payer: Self-pay | Admitting: Family

## 2011-10-04 NOTE — Telephone Encounter (Signed)
Per Dawn at Dr Abbie Sons office [phone], they do not show that pt has returned for u/s or follow up after consultation visit.

## 2011-10-04 NOTE — Telephone Encounter (Signed)
Letter mailed to pt instructing him to schedule a follow up apt with her office.

## 2011-10-04 NOTE — Telephone Encounter (Signed)
Message copied by Kathi Simpers on Wed Oct 04, 2011  9:38 AM ------      Message from: O'SULLIVAN, MELISSA      Created: Tue Oct 03, 2011  9:20 AM       Could you pls call [facility] hepatology and request pt records?      thanks

## 2011-10-05 ENCOUNTER — Telehealth: Payer: Self-pay | Admitting: *Deleted

## 2011-10-05 ENCOUNTER — Ambulatory Visit: Payer: Medicare Other | Admitting: Rehabilitation

## 2011-10-05 NOTE — Telephone Encounter (Signed)
Message copied by Kathi Simpers on Thu Oct 05, 2011  9:01 AM ------      Message from: O'SULLIVAN, MELISSA      Created: Wed Oct 04, 2011  1:08 PM       I reviewed lab work from [place]. It shows immunity to hep A.  When he returns in 1 month, please give him Hepatitis B vaccine, not twinrix. thanks

## 2011-10-05 NOTE — Telephone Encounter (Signed)
Notified pt, appt info updated.

## 2011-10-09 ENCOUNTER — Ambulatory Visit: Payer: Medicare Other | Admitting: Neurology

## 2011-10-10 ENCOUNTER — Ambulatory Visit: Payer: Medicare Other | Admitting: Rehabilitation

## 2011-10-12 ENCOUNTER — Ambulatory Visit: Payer: Medicare Other | Admitting: Physical Therapy

## 2011-10-17 ENCOUNTER — Ambulatory Visit: Payer: Medicare Other | Admitting: Rehabilitation

## 2011-10-19 ENCOUNTER — Encounter: Payer: Medicare Other | Admitting: Rehabilitation

## 2011-10-24 ENCOUNTER — Ambulatory Visit: Payer: Medicare Other | Admitting: Physical Therapy

## 2011-10-26 ENCOUNTER — Ambulatory Visit: Payer: Medicare Other | Admitting: Physical Therapy

## 2011-10-31 ENCOUNTER — Emergency Department (HOSPITAL_BASED_OUTPATIENT_CLINIC_OR_DEPARTMENT_OTHER)
Admission: EM | Admit: 2011-10-31 | Discharge: 2011-10-31 | Disposition: A | Discharge status: Home / Self Care | Payer: Medicare Other | Attending: Emergency Medicine | Admitting: Emergency Medicine

## 2011-10-31 ENCOUNTER — Ambulatory Visit (INDEPENDENT_AMBULATORY_CARE_PROVIDER_SITE_OTHER): Payer: Medicare Other | Admitting: Family

## 2011-10-31 ENCOUNTER — Other Ambulatory Visit: Payer: Self-pay | Admitting: *Deleted

## 2011-10-31 ENCOUNTER — Emergency Department (HOSPITAL_BASED_OUTPATIENT_CLINIC_OR_DEPARTMENT_OTHER): Payer: Medicare Other

## 2011-10-31 ENCOUNTER — Ambulatory Visit: Payer: Medicare Other | Attending: Neurology | Admitting: Rehabilitation

## 2011-10-31 DIAGNOSIS — M79673 Pain in unspecified foot: Secondary | ICD-10-CM

## 2011-10-31 DIAGNOSIS — M6281 Muscle weakness (generalized): Secondary | ICD-10-CM | POA: Insufficient documentation

## 2011-10-31 DIAGNOSIS — IMO0001 Reserved for inherently not codable concepts without codable children: Secondary | ICD-10-CM | POA: Insufficient documentation

## 2011-10-31 DIAGNOSIS — M7989 Other specified soft tissue disorders: Secondary | ICD-10-CM | POA: Insufficient documentation

## 2011-10-31 DIAGNOSIS — M79609 Pain in unspecified limb: Secondary | ICD-10-CM | POA: Insufficient documentation

## 2011-10-31 DIAGNOSIS — B351 Tinea unguium: Secondary | ICD-10-CM | POA: Insufficient documentation

## 2011-10-31 DIAGNOSIS — Z23 Encounter for immunization: Secondary | ICD-10-CM

## 2011-10-31 DIAGNOSIS — R262 Difficulty in walking, not elsewhere classified: Secondary | ICD-10-CM | POA: Insufficient documentation

## 2011-10-31 DIAGNOSIS — M25579 Pain in unspecified ankle and joints of unspecified foot: Secondary | ICD-10-CM | POA: Insufficient documentation

## 2011-10-31 DIAGNOSIS — F17200 Nicotine dependence, unspecified, uncomplicated: Secondary | ICD-10-CM | POA: Insufficient documentation

## 2011-10-31 MED ORDER — HYDROCODONE-ACETAMINOPHEN 5-325 MG PO TABS: ORAL_TABLET | ORAL | Status: DC

## 2011-10-31 MED ORDER — HYDROCODONE-ACETAMINOPHEN 5-325 MG PO TABS: 2.0000 | ORAL_TABLET | ORAL | Status: AC | PRN
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
## 2011-10-31 NOTE — Discharge Instructions (Signed)
Your x-rays did not reveal any fractures.  We would recommend that you elevate her foot as much as possible and take pain medication as directed.  Continue with your therapy return here if condition worsens

## 2011-10-31 NOTE — Telephone Encounter (Signed)
Pt requests refill of hydrocodone that he takes prior to PT. Last received #20 09/25/11.  Please advise.

## 2011-10-31 NOTE — Telephone Encounter (Signed)
OK to send 20 thanks.

## 2011-10-31 NOTE — Telephone Encounter (Signed)
Randy [name], pt received #10 Hydrocodone from ER visit today.  Do you still want me to call in #20?

## 2011-10-31 NOTE — Telephone Encounter (Signed)
Rx left on pharmacy voicemail.

## 2011-10-31 NOTE — Telephone Encounter (Signed)
OK to send # 20 no refills. 

## 2011-11-02 ENCOUNTER — Ambulatory Visit: Payer: Medicare Other | Admitting: Rehabilitation

## 2011-11-07 ENCOUNTER — Ambulatory Visit: Payer: Medicare Other | Admitting: Physical Therapy

## 2011-11-14 ENCOUNTER — Ambulatory Visit: Payer: Medicare Other | Admitting: Rehabilitation

## 2011-11-16 ENCOUNTER — Ambulatory Visit: Payer: Medicare Other | Admitting: Rehabilitation

## 2011-11-21 ENCOUNTER — Ambulatory Visit: Payer: Medicare Other | Admitting: Rehabilitation

## 2011-11-23 ENCOUNTER — Ambulatory Visit: Payer: Medicare Other | Admitting: Physical Therapy

## 2011-11-28 ENCOUNTER — Ambulatory Visit: Payer: Medicare Other | Attending: Neurology | Admitting: Rehabilitation

## 2011-11-28 DIAGNOSIS — IMO0001 Reserved for inherently not codable concepts without codable children: Secondary | ICD-10-CM | POA: Insufficient documentation

## 2011-11-28 DIAGNOSIS — M6281 Muscle weakness (generalized): Secondary | ICD-10-CM | POA: Insufficient documentation

## 2011-11-28 DIAGNOSIS — M25579 Pain in unspecified ankle and joints of unspecified foot: Secondary | ICD-10-CM | POA: Insufficient documentation

## 2011-11-28 DIAGNOSIS — R262 Difficulty in walking, not elsewhere classified: Secondary | ICD-10-CM | POA: Insufficient documentation

## 2011-12-05 ENCOUNTER — Ambulatory Visit: Payer: Medicare Other | Admitting: Physical Therapy

## 2011-12-07 ENCOUNTER — Ambulatory Visit: Payer: Medicare Other | Admitting: Rehabilitation

## 2011-12-08 ENCOUNTER — Encounter: Payer: Self-pay | Admitting: Neurology

## 2011-12-08 ENCOUNTER — Ambulatory Visit (INDEPENDENT_AMBULATORY_CARE_PROVIDER_SITE_OTHER): Payer: Medicare Other | Admitting: Neurology

## 2011-12-08 VITALS — BP 140/74 | HR 72 | Ht 68.0 in | Wt 158.0 lb

## 2011-12-08 DIAGNOSIS — M216X9 Other acquired deformities of unspecified foot: Secondary | ICD-10-CM

## 2011-12-08 DIAGNOSIS — M21371 Foot drop, right foot: Secondary | ICD-10-CM

## 2011-12-08 MED ORDER — GABAPENTIN 300 MG PO CAPS: 600.0000 mg | ORAL_CAPSULE | Freq: Three times a day (TID) | ORAL | Status: DC
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
## 2011-12-12 ENCOUNTER — Ambulatory Visit: Payer: Medicare Other | Admitting: Rehabilitation

## 2011-12-14 ENCOUNTER — Ambulatory Visit: Payer: Medicare Other | Admitting: Physical Therapy

## 2011-12-19 ENCOUNTER — Ambulatory Visit: Payer: Medicare Other | Admitting: Physical Therapy

## 2011-12-21 ENCOUNTER — Telehealth: Payer: Self-pay | Admitting: Family

## 2011-12-21 ENCOUNTER — Ambulatory Visit: Payer: Medicare Other | Admitting: Physical Therapy

## 2011-12-21 NOTE — Telephone Encounter (Signed)
Please advise 

## 2011-12-22 MED ORDER — SILDENAFIL CITRATE 50 MG PO TABS: 50.0000 mg | ORAL_TABLET | Freq: Every day | ORAL | Status: DC | PRN

## 2011-12-22 NOTE — Telephone Encounter (Signed)
Left message on machine to return my call. 

## 2011-12-22 NOTE — Telephone Encounter (Signed)
rx sent

## 2011-12-22 NOTE — Telephone Encounter (Signed)
Notified pt of Rx completion.

## 2011-12-26 ENCOUNTER — Ambulatory Visit: Payer: Medicare Other | Admitting: Physical Therapy

## 2011-12-28 ENCOUNTER — Ambulatory Visit: Payer: Medicare Other | Attending: Neurology | Admitting: Rehabilitation

## 2011-12-28 DIAGNOSIS — M6281 Muscle weakness (generalized): Secondary | ICD-10-CM | POA: Insufficient documentation

## 2011-12-28 DIAGNOSIS — R262 Difficulty in walking, not elsewhere classified: Secondary | ICD-10-CM | POA: Insufficient documentation

## 2011-12-28 DIAGNOSIS — IMO0001 Reserved for inherently not codable concepts without codable children: Secondary | ICD-10-CM | POA: Insufficient documentation

## 2011-12-28 DIAGNOSIS — M25579 Pain in unspecified ankle and joints of unspecified foot: Secondary | ICD-10-CM | POA: Insufficient documentation

## 2012-01-01 NOTE — Progress Notes (Signed)
Patient had surprisingly a sacral plexopathy on EMG exam.  This was likely from hip arthroplasty.  However, appears to be getting better so I would just continue to observe.

## 2012-01-02 ENCOUNTER — Ambulatory Visit: Payer: Medicare Other | Admitting: Rehabilitation

## 2012-01-04 ENCOUNTER — Telehealth: Payer: Self-pay | Admitting: Family

## 2012-01-04 ENCOUNTER — Ambulatory Visit: Payer: Medicare Other | Admitting: Rehabilitation

## 2012-01-04 MED ORDER — HYDROCODONE-ACETAMINOPHEN 5-325 MG PO TABS: ORAL_TABLET | ORAL | Status: DC

## 2012-01-04 NOTE — Telephone Encounter (Signed)
Patient can into office requesting refill for Hydrocodone,  Please call pt

## 2012-01-04 NOTE — Telephone Encounter (Signed)
Verified with pt to use [facility] for refill. Refill left on pharmacy voicemail #20 x no refills.

## 2012-01-09 ENCOUNTER — Ambulatory Visit: Payer: Medicare Other | Admitting: Rehabilitation

## 2012-01-11 ENCOUNTER — Ambulatory Visit: Payer: Medicare Other | Admitting: Physical Therapy

## 2012-01-16 ENCOUNTER — Ambulatory Visit: Payer: Medicare Other | Admitting: Rehabilitation

## 2012-01-18 ENCOUNTER — Encounter: Payer: Medicare Other | Admitting: Rehabilitation

## 2012-01-23 ENCOUNTER — Ambulatory Visit: Payer: Medicare Other | Admitting: Rehabilitation

## 2012-01-25 ENCOUNTER — Encounter: Payer: Medicare Other | Admitting: Rehabilitation

## 2012-01-30 ENCOUNTER — Ambulatory Visit: Payer: Medicare Other | Attending: Neurology | Admitting: Rehabilitation

## 2012-01-30 DIAGNOSIS — IMO0001 Reserved for inherently not codable concepts without codable children: Secondary | ICD-10-CM | POA: Insufficient documentation

## 2012-01-30 DIAGNOSIS — M6281 Muscle weakness (generalized): Secondary | ICD-10-CM | POA: Insufficient documentation

## 2012-01-30 DIAGNOSIS — R262 Difficulty in walking, not elsewhere classified: Secondary | ICD-10-CM | POA: Insufficient documentation

## 2012-01-30 DIAGNOSIS — M25579 Pain in unspecified ankle and joints of unspecified foot: Secondary | ICD-10-CM | POA: Insufficient documentation

## 2012-02-06 ENCOUNTER — Telehealth: Payer: Self-pay | Admitting: Family

## 2012-02-06 ENCOUNTER — Ambulatory Visit: Payer: Medicare Other | Admitting: Physical Therapy

## 2012-02-06 MED ORDER — HYDROCODONE-ACETAMINOPHEN 5-325 MG PO TABS: ORAL_TABLET | ORAL | Status: DC

## 2012-02-06 NOTE — Telephone Encounter (Signed)
Patient walks into  Office need his pain medication    Please call

## 2012-02-06 NOTE — Telephone Encounter (Signed)
OK to send #30 no refills.  

## 2012-02-06 NOTE — Telephone Encounter (Signed)
Pt last filled hydrocodone on 01/04/12 #20. Pt verified that he is doing PT 2 x a week but also takes one in the morning because "the nerve hurts so bad in his foot until I can get moving around." Reports that he walks mostly with his cane now.  Please advise re: refill.

## 2012-02-06 NOTE — Telephone Encounter (Signed)
Rx called to [facility]. Attempted to notify pt and left message that request has been completed and to call if any questions.

## 2012-02-13 ENCOUNTER — Ambulatory Visit: Payer: Medicare Other | Admitting: Rehabilitation

## 2012-02-20 ENCOUNTER — Ambulatory Visit: Payer: Medicare Other | Admitting: Physical Therapy

## 2012-03-05 ENCOUNTER — Telehealth: Payer: Self-pay | Admitting: Family

## 2012-03-05 NOTE — Telephone Encounter (Signed)
OK to send 30 tabs with zero refills.  

## 2012-03-05 NOTE — Telephone Encounter (Signed)
Patient states that he needs a refill of hydrocodone sent to [facility] on [address] in [place]

## 2012-03-05 NOTE — Telephone Encounter (Signed)
Medication last refilled on 02/06/12 #30. Pt has appt on 04/02/12 with [place]. Please advise re: refill.

## 2012-03-06 MED ORDER — HYDROCODONE-ACETAMINOPHEN 5-325 MG PO TABS: ORAL_TABLET | ORAL | Status: DC

## 2012-03-06 NOTE — Telephone Encounter (Signed)
Refill left on [facility]; #30 x no refills. Pt notified.

## 2012-04-02 ENCOUNTER — Telehealth: Payer: Self-pay | Admitting: Family

## 2012-04-02 ENCOUNTER — Encounter: Payer: Self-pay | Admitting: Family

## 2012-04-02 ENCOUNTER — Ambulatory Visit (INDEPENDENT_AMBULATORY_CARE_PROVIDER_SITE_OTHER): Payer: Medicare Other | Admitting: Family

## 2012-04-02 ENCOUNTER — Ambulatory Visit (HOSPITAL_BASED_OUTPATIENT_CLINIC_OR_DEPARTMENT_OTHER)
Admission: RE | Admit: 2012-04-02 | Discharge: 2012-04-02 | Disposition: A | Discharge status: Home / Self Care | Payer: Medicare Other | Source: Ambulatory Visit | Attending: Family | Admitting: Family

## 2012-04-02 VITALS — BP 144/82 | HR 80 | Temp 98.6°F | Resp 16 | Ht 69.0 in | Wt 160.0 lb

## 2012-04-02 DIAGNOSIS — M79646 Pain in unspecified finger(s): Secondary | ICD-10-CM

## 2012-04-02 DIAGNOSIS — M21371 Foot drop, right foot: Secondary | ICD-10-CM

## 2012-04-02 DIAGNOSIS — M79609 Pain in unspecified limb: Secondary | ICD-10-CM | POA: Insufficient documentation

## 2012-04-02 DIAGNOSIS — B1710 Acute hepatitis C without hepatic coma: Secondary | ICD-10-CM

## 2012-04-02 DIAGNOSIS — M216X9 Other acquired deformities of unspecified foot: Secondary | ICD-10-CM

## 2012-04-02 DIAGNOSIS — IMO0001 Reserved for inherently not codable concepts without codable children: Secondary | ICD-10-CM

## 2012-04-02 DIAGNOSIS — B1920 Unspecified viral hepatitis C without hepatic coma: Secondary | ICD-10-CM

## 2012-04-02 DIAGNOSIS — R030 Elevated blood-pressure reading, without diagnosis of hypertension: Secondary | ICD-10-CM

## 2012-04-02 MED ORDER — HYDROCODONE-ACETAMINOPHEN 5-325 MG PO TABS: ORAL_TABLET | ORAL | Status: DC

## 2012-04-02 NOTE — Assessment & Plan Note (Signed)
X ray was performed at pt request and notes arthritic changes. Recommend motrin prn sparingly
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
## 2012-04-02 NOTE — Assessment & Plan Note (Signed)
Improved. Following PT he is now able to walk without the aid of crutches or a cane.

## 2012-04-02 NOTE — Telephone Encounter (Signed)
He can try tylenol instead, but do not take with vicodin as vicodin already has tylenol.

## 2012-04-02 NOTE — Assessment & Plan Note (Signed)
BP is borderline elevated.  Monitor.

## 2012-04-02 NOTE — Telephone Encounter (Signed)
Notified pt and he voices understanding. 

## 2012-04-02 NOTE — Telephone Encounter (Signed)
Message copied by Kathi Simpers on Tue Apr 02, 2012  4:14 PM ------      Message from: O'SULLIVAN, MELISSA      Created: Tue Apr 02, 2012  1:27 PM       Pls call pt and let him know that his Thumb x ray shows arthritis.  He can use motrin as needed for the next few days.

## 2012-04-02 NOTE — Telephone Encounter (Signed)
Gi no longer sees patients for hep c  Please advise alt provider

## 2012-04-02 NOTE — Patient Instructions (Addendum)
Please complete your blood work prior to leaving.  You will be contact about your referral to Dr. Marcelene Butte (Liver specialist). Please let us know if you have not heard back within 1 week about your referral. Please schedule a follow up appointment in 3 months.

## 2012-04-02 NOTE — Assessment & Plan Note (Signed)
He tells me that he never did follow through with referral to Dr. Marcelene Butte. Check LFT's, Check Hep C viral load.  Re-initiate referral to GI.

## 2012-04-02 NOTE — Telephone Encounter (Signed)
Notified pt of xray result and instruction as below. Pt reports that he was told not to take motrin due to affects on his kidneys. Please advise if there is any other recommendation.    Result Note     Pls call pt and let him know that his Thumb x ray shows arthritis. He can use motrin as needed for the next few days.

## 2012-04-03 LAB — HEPATIC FUNCTION PANEL
AST: 33 U/L (ref 0–37)
Alkaline Phosphatase: 80 U/L (ref 39–117)
Indirect Bilirubin: 0.5 mg/dL (ref 0.0–0.9)
Total Protein: 7.4 g/dL (ref 6.0–8.3)

## 2012-04-03 NOTE — Telephone Encounter (Signed)
Marj, Could you please try to arrange appointment with Dr. Drema Halon at [facility] instead?

## 2012-07-03 ENCOUNTER — Ambulatory Visit (INDEPENDENT_AMBULATORY_CARE_PROVIDER_SITE_OTHER): Payer: Medicare Other | Admitting: Family

## 2012-07-03 ENCOUNTER — Encounter: Payer: Self-pay | Admitting: Family

## 2012-07-03 VITALS — BP 120/76 | HR 78 | Temp 98.5°F | Resp 16 | Ht 69.0 in | Wt 156.1 lb

## 2012-07-03 DIAGNOSIS — M79646 Pain in unspecified finger(s): Secondary | ICD-10-CM

## 2012-07-03 DIAGNOSIS — Z23 Encounter for immunization: Secondary | ICD-10-CM

## 2012-07-03 DIAGNOSIS — M21371 Foot drop, right foot: Secondary | ICD-10-CM

## 2012-07-03 DIAGNOSIS — R030 Elevated blood-pressure reading, without diagnosis of hypertension: Secondary | ICD-10-CM

## 2012-07-03 DIAGNOSIS — B1710 Acute hepatitis C without hepatic coma: Secondary | ICD-10-CM

## 2012-07-03 DIAGNOSIS — M79609 Pain in unspecified limb: Secondary | ICD-10-CM

## 2012-07-03 DIAGNOSIS — M216X9 Other acquired deformities of unspecified foot: Secondary | ICD-10-CM

## 2012-07-03 MED ORDER — HYDROCODONE-ACETAMINOPHEN 5-325 MG PO TABS: ORAL_TABLET | ORAL | Status: DC
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
## 2012-07-03 NOTE — Assessment & Plan Note (Signed)
Pt following at [facility] with Dr. Wyvonnia Lora- he is scheduled for liver biopsy.  3rd hep b vaccine today.

## 2012-07-03 NOTE — Patient Instructions (Addendum)
Please follow up in 6 months, sooner if problems/concerns.  

## 2012-07-03 NOTE — Assessment & Plan Note (Signed)
Clinically stable, now ambulating.  Requests rx for long brush to help him wash his right foot and back.  Rx provided.

## 2012-07-03 NOTE — Assessment & Plan Note (Signed)
BP looks good today.  Monitor.  

## 2012-07-03 NOTE — Assessment & Plan Note (Signed)
Reports that this is resolved.

## 2012-07-03 NOTE — Addendum Note (Signed)
Addended by: Mervin Kung A on: 07/03/2012 01:07 PM   Modules accepted: Orders

## 2012-08-21 ENCOUNTER — Telehealth: Payer: Self-pay | Admitting: *Deleted

## 2012-08-21 ENCOUNTER — Encounter: Payer: Self-pay | Admitting: Family

## 2012-08-21 NOTE — Telephone Encounter (Signed)
See letter.

## 2012-08-21 NOTE — Telephone Encounter (Signed)
Letter mailed

## 2012-08-21 NOTE — Telephone Encounter (Signed)
Notified pt. 

## 2012-08-21 NOTE — Telephone Encounter (Signed)
Pt states he has signed up for the Silver [employer]neakers exercise program. He will be participating in water exercises for 45 minutes at a time. Pt states they are requesting letter from Provider stating he is able to participate. Pt states he has been approved by PT as well.  Pt requests letter be mailed to his home.  Please advise.

## 2012-11-25 ENCOUNTER — Encounter: Payer: Self-pay | Admitting: Podiatry

## 2012-11-25 ENCOUNTER — Ambulatory Visit (INDEPENDENT_AMBULATORY_CARE_PROVIDER_SITE_OTHER): Payer: Medicare HMO | Admitting: Podiatry

## 2012-11-25 VITALS — BP 158/92 | HR 67

## 2012-11-25 DIAGNOSIS — L853 Xerosis cutis: Secondary | ICD-10-CM

## 2012-11-25 DIAGNOSIS — B351 Tinea unguium: Secondary | ICD-10-CM

## 2012-11-25 DIAGNOSIS — L84 Corns and callosities: Secondary | ICD-10-CM | POA: Insufficient documentation

## 2012-11-25 DIAGNOSIS — M25571 Pain in right ankle and joints of right foot: Secondary | ICD-10-CM

## 2012-11-25 DIAGNOSIS — M25579 Pain in unspecified ankle and joints of unspecified foot: Secondary | ICD-10-CM | POA: Insufficient documentation

## 2012-11-25 DIAGNOSIS — L738 Other specified follicular disorders: Secondary | ICD-10-CM

## 2012-11-25 MED ORDER — AMMONIUM LACTATE 12 % EX LOTN: 1.0000 "application " | TOPICAL_LOTION | CUTANEOUS | Status: DC | PRN

## 2012-11-25 NOTE — Progress Notes (Signed)
Subjective: 62 y.o. year old male patient presents complaining of painful nails and callus on right foot. Patient requests toe nails and calluses trimmed. Patient is not able to bend his right leg and cannot reach toes. Stated that he has been using prescribed lotion for his dry skin and is doing well. He wants that prescription refilled.   Patient Summary List & History reviewed for allergies, medications, medical problems and surgical history.  Dermatologic:  Dry skin both feet.  Dystrophic nail 2-5 bilateral.  Atrophied and crumbling nail over nail bed on both great toes. Painful callus under the right hallux plantar medial, painful.  Vascular: All pedal pulses are palpable.  No edema or erythema noted.  Orthopedic:  Rectus foot without gross deformities.   Neurologic:  Returning feelings on right foot.  Normal on left.  Assessment: Dystrophic mycotic nails x 10. Painful plantar callus right hallux.  Treatment: All mycotic nails and calluses debrided.  Return as needed. Rx. For Lac Hydrin lotion 12%

## 2012-12-02 ENCOUNTER — Ambulatory Visit: Payer: Medicare Other | Admitting: Family

## 2012-12-09 ENCOUNTER — Ambulatory Visit: Payer: Self-pay | Admitting: Family

## 2012-12-24 ENCOUNTER — Ambulatory Visit (INDEPENDENT_AMBULATORY_CARE_PROVIDER_SITE_OTHER): Payer: Medicare Other | Admitting: Family

## 2012-12-24 ENCOUNTER — Encounter: Payer: Self-pay | Admitting: Family

## 2012-12-24 VITALS — BP 130/88 | HR 72 | Temp 98.2°F | Resp 16 | Ht 69.0 in | Wt 159.1 lb

## 2012-12-24 DIAGNOSIS — M25571 Pain in right ankle and joints of right foot: Secondary | ICD-10-CM

## 2012-12-24 DIAGNOSIS — M25579 Pain in unspecified ankle and joints of unspecified foot: Secondary | ICD-10-CM

## 2012-12-24 DIAGNOSIS — B1710 Acute hepatitis C without hepatic coma: Secondary | ICD-10-CM

## 2012-12-24 NOTE — Patient Instructions (Addendum)
You will be contacted about your referral to pain management. Please schedule follow up with Dr. Wyvonnia Lora.
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
## 2012-12-25 NOTE — Assessment & Plan Note (Signed)
Advised pt on the importance of following through with hep C treatment recommendations. Advised pt to schedule follow up with Dr. Wyvonnia Lora and he is agreeable.

## 2012-12-25 NOTE — Assessment & Plan Note (Addendum)
Pt with ongoing pain in the right foot.  Will refer to pain management for further care.

## 2013-02-25 ENCOUNTER — Ambulatory Visit: Payer: Medicare HMO | Admitting: Podiatry

## 2013-04-15 ENCOUNTER — Telehealth: Payer: Self-pay | Admitting: Family

## 2013-04-15 NOTE — Telephone Encounter (Signed)
Please Advise/SLS  

## 2013-04-15 NOTE — Telephone Encounter (Signed)
Marj, could you please add Dr. Mariel Aloe name to referral? Thanks.

## 2013-04-15 NOTE — Telephone Encounter (Signed)
Patient states that he has [employer] and the rep from [place] told him that Dr. Mariel Aloe name has to be on the referral to the podiatry, not Melissa. He says that his insurance will not cover his podiatry visits because the referral was placed under Melissa's name.

## 2013-04-21 ENCOUNTER — Ambulatory Visit: Payer: Medicare HMO | Admitting: Podiatry

## 2013-04-22 ENCOUNTER — Ambulatory Visit (INDEPENDENT_AMBULATORY_CARE_PROVIDER_SITE_OTHER): Payer: Medicare HMO | Admitting: Family

## 2013-04-22 ENCOUNTER — Encounter: Payer: Self-pay | Admitting: Family

## 2013-04-22 VITALS — BP 120/80 | HR 64 | Temp 98.0°F | Resp 16 | Ht 69.0 in

## 2013-04-22 DIAGNOSIS — B351 Tinea unguium: Secondary | ICD-10-CM

## 2013-04-22 NOTE — Assessment & Plan Note (Signed)
Advised pt to give additional information to podiatry re: Dr. Danise Edge as my supervising physician so that claims can be properly filed.  He is instructed to arrange follow up appointment with podiatry to have his nails trimmed.  He is at risk for skin breakdown due to overgrowth of nails.  He is to let us know if he has difficulty getting in with podiatry. Pt verbalizes understanding.

## 2013-04-22 NOTE — Patient Instructions (Signed)
Please give your podiatrist the additional billing information we discussed. Schedule follow up appointment with podiatry to have your toenails trimmed. Call us if you have difficulty getting in to see your podiatrist.
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
## 2013-05-19 ENCOUNTER — Telehealth: Payer: Self-pay | Admitting: Family

## 2013-05-19 DIAGNOSIS — B351 Tinea unguium: Secondary | ICD-10-CM

## 2013-05-19 NOTE — Telephone Encounter (Signed)
Patient has already seen the podiatrist, insurance will no pay since he did not have a referral [facility] [phone] Please advise.

## 2013-05-20 NOTE — Telephone Encounter (Signed)
Notified pt. And he states he has contact [facility] and arranged appt. Nothing further needed at this time. Referral faxed to [phone].

## 2013-05-20 NOTE — Telephone Encounter (Signed)
We can fax referral paperwork to the to see if this helps.

## 2013-09-22 ENCOUNTER — Telehealth: Payer: Self-pay | Admitting: Family

## 2013-09-22 DIAGNOSIS — B351 Tinea unguium: Secondary | ICD-10-CM

## 2013-09-22 NOTE — Telephone Encounter (Signed)
Patient states that he currently owes money to the podiatrist that he is currently seeing and they will not see him until he pays balance. He would like to be referred to another podiatrist.

## 2013-10-28 ENCOUNTER — Ambulatory Visit (INDEPENDENT_AMBULATORY_CARE_PROVIDER_SITE_OTHER): Payer: Commercial Managed Care - HMO | Admitting: Family

## 2013-10-28 ENCOUNTER — Encounter: Payer: Self-pay | Admitting: Family

## 2013-10-28 VITALS — BP 130/80 | HR 62 | Temp 97.8°F | Resp 16 | Ht 69.0 in | Wt 159.0 lb

## 2013-10-28 DIAGNOSIS — G894 Chronic pain syndrome: Secondary | ICD-10-CM

## 2013-10-28 DIAGNOSIS — M79606 Pain in leg, unspecified: Secondary | ICD-10-CM

## 2013-10-28 DIAGNOSIS — B1920 Unspecified viral hepatitis C without hepatic coma: Secondary | ICD-10-CM

## 2013-10-28 DIAGNOSIS — M79609 Pain in unspecified limb: Secondary | ICD-10-CM

## 2013-10-28 DIAGNOSIS — E785 Hyperlipidemia, unspecified: Secondary | ICD-10-CM

## 2013-10-28 DIAGNOSIS — G8929 Other chronic pain: Secondary | ICD-10-CM

## 2013-10-28 DIAGNOSIS — B1710 Acute hepatitis C without hepatic coma: Secondary | ICD-10-CM

## 2013-10-28 MED ORDER — HYDROCODONE-ACETAMINOPHEN 5-325 MG PO TABS: ORAL_TABLET | ORAL | Status: DC

## 2013-10-28 NOTE — Patient Instructions (Signed)
Please complete lab work prior to leaving. Follow up in 3 months.  
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
## 2013-10-28 NOTE — Assessment & Plan Note (Signed)
A controlled substance contract was signed today. He was also sent for urine drug tox. Advised pt that I would manage his pain medication until he could get in with a pain management specialist.

## 2013-10-28 NOTE — Assessment & Plan Note (Signed)
Discussed that hep c can lead to liver cirrhosis/failure and liver cancer.  Discussed that there are various treatment options available for hep C. He is adamant that he understands this but declines further work up.

## 2013-10-28 NOTE — Progress Notes (Signed)
Pre visit review using our clinic review tool, if applicable. No additional management support is needed unless otherwise documented below in the visit note. 

## 2013-10-29 ENCOUNTER — Telehealth: Payer: Self-pay | Admitting: Family

## 2013-10-29 NOTE — Telephone Encounter (Signed)
Relevant patient education mailed to patient.  

## 2013-10-31 ENCOUNTER — Telehealth: Payer: Self-pay | Admitting: Family

## 2013-10-31 NOTE — Telephone Encounter (Signed)
I see that pt did not complete lab work as ordered.  I need him to come to the lab today to complete lab work please.

## 2013-10-31 NOTE — Telephone Encounter (Signed)
Left detailed message on cell # to complete labs today or call if any questions.

## 2013-11-04 ENCOUNTER — Encounter: Payer: Self-pay | Admitting: Family

## 2013-12-18 ENCOUNTER — Other Ambulatory Visit: Payer: Self-pay | Admitting: Podiatry

## 2013-12-24 ENCOUNTER — Encounter (HOSPITAL_BASED_OUTPATIENT_CLINIC_OR_DEPARTMENT_OTHER): Payer: Self-pay | Admitting: Emergency Medicine

## 2013-12-24 ENCOUNTER — Emergency Department (HOSPITAL_BASED_OUTPATIENT_CLINIC_OR_DEPARTMENT_OTHER)
Admission: EM | Admit: 2013-12-24 | Discharge: 2013-12-24 | Disposition: A | Discharge status: Home / Self Care | Payer: Medicare HMO | Attending: Emergency Medicine | Admitting: Emergency Medicine

## 2013-12-24 DIAGNOSIS — Z760 Encounter for issue of repeat prescription: Secondary | ICD-10-CM | POA: Diagnosis not present

## 2013-12-24 DIAGNOSIS — Z87828 Personal history of other (healed) physical injury and trauma: Secondary | ICD-10-CM | POA: Diagnosis not present

## 2013-12-24 DIAGNOSIS — Z87891 Personal history of nicotine dependence: Secondary | ICD-10-CM | POA: Insufficient documentation

## 2013-12-24 DIAGNOSIS — G8929 Other chronic pain: Secondary | ICD-10-CM | POA: Insufficient documentation

## 2013-12-24 DIAGNOSIS — M79609 Pain in unspecified limb: Secondary | ICD-10-CM | POA: Insufficient documentation

## 2013-12-24 DIAGNOSIS — Z8739 Personal history of other diseases of the musculoskeletal system and connective tissue: Secondary | ICD-10-CM | POA: Diagnosis not present

## 2013-12-24 DIAGNOSIS — G609 Hereditary and idiopathic neuropathy, unspecified: Secondary | ICD-10-CM | POA: Insufficient documentation

## 2013-12-24 DIAGNOSIS — Z8619 Personal history of other infectious and parasitic diseases: Secondary | ICD-10-CM | POA: Diagnosis not present

## 2013-12-24 DIAGNOSIS — G629 Polyneuropathy, unspecified: Secondary | ICD-10-CM

## 2013-12-24 DIAGNOSIS — R52 Pain, unspecified: Secondary | ICD-10-CM | POA: Insufficient documentation

## 2013-12-24 DIAGNOSIS — M79671 Pain in right foot: Secondary | ICD-10-CM

## 2013-12-24 MED ORDER — HYDROCODONE-ACETAMINOPHEN 5-325 MG PO TABS
1.0000 | ORAL_TABLET | Freq: Once | ORAL | Status: AC
  Administered 2013-12-24: 1 via ORAL
  Filled 2013-12-24: qty 1

## 2013-12-24 MED ORDER — HYDROCODONE-ACETAMINOPHEN 5-325 MG PO TABS: 1.0000 | ORAL_TABLET | ORAL | Status: DC | PRN

## 2013-12-24 NOTE — ED Notes (Signed)
C/o right foot pain x 1 month-denies injury
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
## 2013-12-24 NOTE — Discharge Instructions (Signed)
1. Medications: vicodin, usual home medications 2. Treatment: rest, drink plenty of fluids,  3. Follow Up: Please followup with your primary doctor for discussion of your diagnoses and further evaluation after today's visit; if you do not have a primary care doctor use the resource guide provided to find one;    Neuropathic Pain We often think that pain has a physical cause. If we get rid of the cause, the pain should go away. Nerves themselves can also cause pain. It is called neuropathic pain, which means nerve abnormality. It may be difficult for the patients who have it and for the treating caregivers. Pain is usually described as acute (short-lived) or chronic (long-lasting). Acute pain is related to the physical sensations caused by an injury. It can last from a few seconds to many weeks, but it usually goes away when normal healing occurs. Chronic pain lasts beyond the typical healing time. With neuropathic pain, the nerve fibers themselves may be damaged or injured. They then send incorrect signals to other pain centers. The pain you feel is real, but the cause is not easy to find.  CAUSES  Chronic pain can result from diseases, such as diabetes and shingles (an infection related to chickenpox), or from trauma, surgery, or amputation. It can also happen without any known injury or disease. The nerves are sending pain messages, even though there is no identifiable cause for such messages.   Other common causes of neuropathy include diabetes, phantom limb pain, or Regional Pain Syndrome (RPS).  As with all forms of chronic back pain, if neuropathy is not correctly treated, there can be a number of associated problems that lead to a downward cycle for the patient. These include depression, sleeplessness, feelings of fear and anxiety, limited social interaction and inability to do normal daily activities or work.  The most dramatic and mysterious example of neuropathic pain is called "phantom limb  syndrome." This occurs when an arm or a leg has been removed because of illness or injury. The brain still gets pain messages from the nerves that originally carried impulses from the missing limb. These nerves now seem to misfire and cause troubling pain.  Neuropathic pain often seems to have no cause. It responds poorly to standard pain treatment. Neuropathic pain can occur after:  Shingles (herpes zoster virus infection).  A lasting burning sensation of the skin, caused usually by injury to a peripheral nerve.  Peripheral neuropathy which is widespread nerve damage, often caused by diabetes or alcoholism.  Phantom limb pain following an amputation.  Facial nerve problems (trigeminal neuralgia).  Multiple sclerosis.  Reflex sympathetic dystrophy.  Pain which comes with cancer and cancer chemotherapy.  Entrapment neuropathy such as when pressure is put on a nerve such as in carpal tunnel syndrome.  Back, leg, and hip problems (sciatica).  Spine or back surgery.  HIV Infection or AIDS where nerves are infected by viruses. Your caregiver can explain items in the above list which may apply to you. SYMPTOMS  Characteristics of neuropathic pain are:  Severe, sharp, electric shock-like, shooting, lightening-like, knife-like.  Pins and needles sensation.  Deep burning, deep cold, or deep ache.  Persistent numbness, tingling, or weakness.  Pain resulting from light touch or other stimulus that would not usually cause pain.  Increased sensitivity to something that would normally cause pain, such as a pinprick. Pain may persist for months or years following the healing of damaged tissues. When this happens, pain signals no longer sound an alarm about current  injuries or injuries about to happen. Instead, the alarm system itself is not working correctly.  Neuropathic pain may get worse instead of better over time. For some people, it can lead to serious disability. It is important  to be aware that severe injury in a limb can occur without a proper, protective pain response.Burns, cuts, and other injuries may go unnoticed. Without proper treatment, these injuries can become infected or lead to further disability. Take any injury seriously, and consult your caregiver for treatment. DIAGNOSIS  When you have a pain with no known cause, your caregiver will probably ask some specific questions:   Do you have any other conditions, such as diabetes, shingles, multiple sclerosis, or HIV infection?  How would you describe your pain? (Neuropathic pain is often described as shooting, stabbing, burning, or searing.)  Is your pain worse at any time of the day? (Neuropathic pain is usually worse at night.)  Does the pain seem to follow a certain physical pathway?  Does the pain come from an area that has missing or injured nerves? (An example would be phantom limb pain.)  Is the pain triggered by minor things such as rubbing against the sheets at night? These questions often help define the type of pain involved. Once your caregiver knows what is happening, treatment can begin. Anticonvulsant, antidepressant drugs, and various pain relievers seem to work in some cases. If another condition, such as diabetes is involved, better management of that disorder may relieve the neuropathic pain.  TREATMENT  Neuropathic pain is frequently long-lasting and tends not to respond to treatment with narcotic type pain medication. It may respond well to other drugs such as antiseizure and antidepressant medications. Usually, neuropathic problems do not completely go away, but partial improvement is often possible with proper treatment. Your caregivers have large numbers of medications available to treat you. Do not be discouraged if you do not get immediate relief. Sometimes different medications or a combination of medications will be tried before you receive the results you are hoping for. See your  caregiver if you have pain that seems to be coming from nowhere and does not go away. Help is available.  SEEK IMMEDIATE MEDICAL CARE IF:   There is a sudden change in the quality of your pain, especially if the change is on only one side of the body.  You notice changes of the skin, such as redness, black or purple discoloration, swelling, or an ulcer.  You cannot move the affected limbs. Document Released: 02/10/2004 Document Revised: 08/07/2011 Document Reviewed: 02/10/2004 [facility] Patient Information 2015 [place], [place]. This information is not intended to replace advice given to you by your health care provider. Make sure you discuss any questions you have with your health care provider.   Emergency Department Resource Guide 1) Find a Doctor and Pay Out of Pocket Although you won't have to find out who is covered by your insurance plan, it is a good idea to ask around and get recommendations. You will then need to call the office and see if the doctor you have chosen will accept you as a new patient and what types of options they offer for patients who are self-pay. Some doctors offer discounts or will set up payment plans for their patients who do not have insurance, but you will need to ask so you aren't surprised when you get to your appointment.  2) Contact Your Local Health Department Not all health departments have doctors that can see patients for sick  visits, but many do, so it is worth a call to see if yours does. If you don't know where your local health department is, you can check in your phone book. The CDC also has a tool to help you locate your state's health department, and many state websites also have listings of all of their local health departments.  3) Find a Walk-in Clinic If your illness is not likely to be very severe or complicated, you may want to try a walk in clinic. These are popping up all over the country in pharmacies, drugstores, and shopping centers.  They're usually staffed by nurse practitioners or physician assistants that have been trained to treat common illnesses and complaints. They're usually fairly quick and inexpensive. However, if you have serious medical issues or chronic medical problems, these are probably not your best option.  No Primary Care Doctor: - Call Health Connect at  [phone] - they can help you locate a primary care doctor that  accepts your insurance, provides certain services, etc. - Physician Referral Service- [phone]  Chronic Pain Problems: Organization         Address  Phone   Notes  [facility]  [phone] Patients need to be referred by their primary care doctor.   Medication Assistance: Organization         Address  Phone   Notes  [facility] Medication [facility] [address] [place], [place] [address] [phone] --Must be a resident of [facility] -- Must have NO insurance coverage whatsoever (no Medicaid/ Medicare, etc.) -- The pt. MUST have a primary care doctor that directs their care regularly and follows them in the community   [facility]ist  [phone]   [employer]  [phone]    Agencies that provide inexpensive medical care: Organization         Address  Phone   Notes  [facility]  [phone]   [facility]    [phone]   [facility] [address] [place], [place] [address] [phone]   [facility] [address], [place] [phone]   Planned Parenthood    [phone]   [facility]    [phone]   Community Health and [facility]  [address], [place] Phone:  [phone], Fax:  [phone] Hours of Operation:  9 am - 6 pm, M-F.  Also accepts Medicaid/Medicare and self-pay.  [facility] for Children  [address], [place] Phone: [phone], Fax: [phone]. Hours of Operation:  8:30 am - 5:30 pm, M-F.  Also accepts Medicaid and self-pay.  [facility] [place] [address] [place] Phone: [phone]   [facility] [address] Natasha Bence [place], [place] [phone], Ext. [phone] Mondays & Thursdays: 7-9 AM.  First 15 patients are seen on a first come, first serve basis.    Medicaid-accepting [facility] Providers:  Organization         Address  Phone   Notes  [facility] [address], [place] [phone] Also accepts self-pay patients.  [facility] [address] Laurell Josephs [place], [place]  [phone]   [facility] [address], [place] [phone]   [facility] [address] [place] [phone]  Renaye RakersVeita Bland [address], [place]   [phone] Only accepts [place] Access Medicaid patients after they have their name applied to their card.   Self-Pay (no insurance) in [facility]:  Organization         Address  Phone   Notes  Sickle Cell Patients, [facility] [address], [place] [phone]   [facility] [address], [place] [phone]) [phone]   [facility] [place]  [address], [place] [phone]) [phone]   [facility]/Dr. Osei-Bonsu  [address] [place] [address], [place] or [phone], [place] [phone]) [phone] Phone number for both [place] and [place] locations is the same.  [facility] and [facility] [address], [place] [phone]) [phone]   [facility] [place] [address] [place] [address], [place] or [address] [phone]) [phone]   [facility] [address], [place] [phone]) [phone], phone; [phone]) [phone], fax Sees patients 1st and 3rd Saturday of every month.  Must not qualify for public or private insurance (i.e.  Medicaid, Medicare, [place] Health Choice, Veterans' Benefits)  Household income should be no more than 200% of the poverty level The clinic cannot treat you if you are pregnant or think you are pregnant  Sexually transmitted diseases are not treated at the clinic.    Dental Care: Organization         Address  Phone  Notes  [facility] of [facility] [address], [place] [phone] Accepts children up to age 62 who are enrolled in [place] or [place] Health Choice; pregnant women with a Medicaid card; and children who have applied for Medicaid or [place] Health Choice, but were declined, whose parents can pay a reduced fee at time of service.  [facility] of [facility] [place]  [address], [place] [phone]) [phone] Accepts children up to age 62 who are enrolled in [place] or [place] Health Choice; pregnant women with a Medicaid card; and children who have applied for Medicaid or [place] Health Choice, but were declined, whose parents can pay a reduced fee at time of service.  [facility]  [address], [place] [phone]) [phone] Patients are seen by appointment only. Walk-ins are not accepted. [facility] will see patients 62 years of age and older. Monday - Tuesday (8am-5pm) Most Wednesdays (8:30-5pm) $30 per visit, cash only  [facility]  [address], [facility] [phone]) [phone] Patients are seen by appointment only. Walk-ins are not accepted. [facility] will see patients 62 years of age and older. One Wednesday Evening (Monthly: Volunteer Based).  $30 per visit, cash only  [facility] [facility]  [phone]) [phone] for adults; Children under age 224, call Graduate Pediatric Dentistry at [phone]) [phone]. Children aged 744-14, please call [phone]) [phone] to request a pediatric application.  Dental services are provided in all areas of dental care including fillings,  crowns and bridges, complete and partial dentures, implants, gum treatment, root canals, and extractions. Preventive care is also provided. Treatment is provided to both adults and children. Patients are selected via a lottery and there is often a waiting list.   [facility] [address], [place]  [phone]) [phone] [email or web address]   [facility] [address], [place], [place] [phone], Ext.  123 Second and Fourth Thursday of each month, opens at 6:30 AM; Clinic ends at 9 AM.  Patients are seen on a first-come first-served basis, and a limited number are seen during each clinic.   [facility]  [address], [place], [place] [phone]) [phone]   Eligibility Requirements You must have lived in [place], [place], or [place] counties for at least the last three months.   You cannot be eligible for state or federal sponsored [place] Cityhealthcare insurance, including CIGNAVeterans Administration, [place]icaid, or [employer] EntertainmentMedicare.   You generally cannot be eligible for healthcare insurance through your employer.    How to apply: Eligibility screenings are held every Tuesday and Wednesday afternoon from 1:00 pm until 4:00 pm. You do not need an appointment for the interview!  [facility] [address], [place], [place] [phone]   [facility]  [phone]   [facility]  [phone]   [facility]  [phone]    Behavioral Health Resources in the Community: Intensive Outpatient Programs Organization         Address  Phone  Notes  [facility] [address], [place], [place] [phone]   [facility] [address], [place], [place] [phone]   [facility] [address], [place], [place]  [phone]   [facility] [address],  [place], [place] [phone] or [phone]   Substance Abuse  Resources Organization         Address  Phone  Notes  [facility] Drug Services  [phone]   [facility]  [phone]   [facility]  [phone]   [facility]  [phone]   Residential & Outpatient Substance Abuse Program  [phone]   Psychological Services Organization         Address  Phone  Notes  [facility]  [phone]- [phone]   [facility]  [phone]- [phone]   [facility] [address], [place] [phone] or [phone]    Mobile Crisis Teams Organization         Address  Phone  Notes  [facility], Mobile Crisis Care Unit  [phone]   [facility]  [address] [place], [place] [phone]   Doristine LocksSharon DeEsch [address] [place] [phone]    Self-Help/Support Groups Organization         Address  Phone             Notes  [facility] - variety of support groups  336- [phone] Call for more information  Narcotics Anonymous (NA), [facility] [address], [place]  2 meetings at this location   Statisticianesidential Treatment Programs Organization         Address  Phone  Notes  [facility] [address],    [place]  [phone]   [facility]  [address], [place] [phone], [place], [place] [phone]   [facility] [address], [place] [phone] Admissions: 8am-3pm M-F  [facility] [address]    [place], [place] [phone]   [facility] [address], [place], [place] [phone]   [facility] [address]  [place], [place] [phone]   [facility] - Intensive Outpatient [address], [place], [place] [phone]   [facility]  [address]  [place], [place] [phone] or [phone]   [facility] ([facility]) [address], [address][phone] Accepts Medicaid  [facility] [place] [address]  [place] [phone] Substance Abuse/Addiction Treatment   [facility] [place] [facility] Organization         Address  Phone  Notes  [facility]  [phone]   Angie Fava, PhD [address] Ervin Knack [place], [place]   [phone] or [phone]   [facility]   [address] [place], [place] [phone]   [facility] [address], [place], [place] [phone] Insurance/Medicaid/sponsorship through [facility] [address]                                    [place], [place] [phone] Therapy/tele-psych/case  [facility] [address], [place] [phone]    Dr. Lolly Mustache  [phone]   [facility]  [employer] [facility] Dept. 1) [address], [place] 2) [address] [place] 3)  [address], [place] [phone]  [phone]   [facility] Child Abuse Hotline 3152999307 or [phone] (After Hours)

## 2013-12-30 NOTE — ED Provider Notes (Signed)
Medical screening examination/treatment/procedure(s) were performed by non-physician practitioner and as supervising physician I was immediately available for consultation/collaboration.   EKG Interpretation None        Rolland PorterMark Azlan, MD 12/30/13 1529

## 2014-02-03 ENCOUNTER — Ambulatory Visit: Payer: Commercial Managed Care - HMO | Admitting: Family

## 2014-02-26 ENCOUNTER — Emergency Department (HOSPITAL_BASED_OUTPATIENT_CLINIC_OR_DEPARTMENT_OTHER): Payer: Medicare HMO

## 2014-02-26 ENCOUNTER — Encounter (HOSPITAL_BASED_OUTPATIENT_CLINIC_OR_DEPARTMENT_OTHER): Payer: Self-pay | Admitting: Emergency Medicine

## 2014-02-26 ENCOUNTER — Emergency Department (HOSPITAL_BASED_OUTPATIENT_CLINIC_OR_DEPARTMENT_OTHER)
Admission: EM | Admit: 2014-02-26 | Discharge: 2014-02-26 | Disposition: A | Discharge status: Home / Self Care | Payer: Medicare HMO | Attending: Emergency Medicine | Admitting: Emergency Medicine

## 2014-02-26 DIAGNOSIS — W172XXA Fall into hole, initial encounter: Secondary | ICD-10-CM | POA: Diagnosis not present

## 2014-02-26 DIAGNOSIS — Y939 Activity, unspecified: Secondary | ICD-10-CM | POA: Insufficient documentation

## 2014-02-26 DIAGNOSIS — Z87891 Personal history of nicotine dependence: Secondary | ICD-10-CM | POA: Insufficient documentation

## 2014-02-26 DIAGNOSIS — Z87828 Personal history of other (healed) physical injury and trauma: Secondary | ICD-10-CM | POA: Diagnosis not present

## 2014-02-26 DIAGNOSIS — S93401A Sprain of unspecified ligament of right ankle, initial encounter: Secondary | ICD-10-CM | POA: Diagnosis not present

## 2014-02-26 DIAGNOSIS — Z8619 Personal history of other infectious and parasitic diseases: Secondary | ICD-10-CM | POA: Insufficient documentation

## 2014-02-26 DIAGNOSIS — Z8739 Personal history of other diseases of the musculoskeletal system and connective tissue: Secondary | ICD-10-CM | POA: Diagnosis not present

## 2014-02-26 DIAGNOSIS — Y929 Unspecified place or not applicable: Secondary | ICD-10-CM | POA: Diagnosis not present

## 2014-02-26 DIAGNOSIS — Z79899 Other long term (current) drug therapy: Secondary | ICD-10-CM | POA: Diagnosis not present

## 2014-02-26 DIAGNOSIS — S99811A Other specified injuries of right ankle, initial encounter: Secondary | ICD-10-CM | POA: Diagnosis present

## 2014-02-26 MED ORDER — HYDROCODONE-ACETAMINOPHEN 5-325 MG PO TABS
1.0000 | ORAL_TABLET | Freq: Once | ORAL | Status: AC
  Administered 2014-02-26: 1 via ORAL
  Filled 2014-02-26: qty 1

## 2014-02-26 MED ORDER — HYDROCODONE-ACETAMINOPHEN 5-325 MG PO TABS: 1.0000 | ORAL_TABLET | Freq: Four times a day (QID) | ORAL | Status: DC | PRN

## 2014-02-26 NOTE — ED Notes (Signed)
C/o right foot pain after stepping in a hole 2 days-pt with slow/limping gait
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
## 2014-02-26 NOTE — ED Notes (Signed)
MD at bedside. 

## 2014-02-26 NOTE — Discharge Instructions (Signed)

## 2014-05-18 ENCOUNTER — Ambulatory Visit (INDEPENDENT_AMBULATORY_CARE_PROVIDER_SITE_OTHER): Payer: Medicare HMO | Admitting: Family

## 2014-05-18 ENCOUNTER — Telehealth: Payer: Self-pay | Admitting: Family

## 2014-05-18 ENCOUNTER — Emergency Department (HOSPITAL_BASED_OUTPATIENT_CLINIC_OR_DEPARTMENT_OTHER)
Admission: EM | Admit: 2014-05-18 | Discharge: 2014-05-18 | Disposition: A | Discharge status: Home / Self Care | Payer: Medicare HMO | Attending: Emergency Medicine | Admitting: Emergency Medicine

## 2014-05-18 ENCOUNTER — Encounter: Payer: Self-pay | Admitting: Family

## 2014-05-18 VITALS — BP 120/86 | HR 74 | Temp 98.0°F | Resp 16 | Ht 68.75 in | Wt 149.4 lb

## 2014-05-18 DIAGNOSIS — Z87891 Personal history of nicotine dependence: Secondary | ICD-10-CM | POA: Insufficient documentation

## 2014-05-18 DIAGNOSIS — M79604 Pain in right leg: Secondary | ICD-10-CM

## 2014-05-18 DIAGNOSIS — Z8619 Personal history of other infectious and parasitic diseases: Secondary | ICD-10-CM | POA: Insufficient documentation

## 2014-05-18 DIAGNOSIS — Z8739 Personal history of other diseases of the musculoskeletal system and connective tissue: Secondary | ICD-10-CM | POA: Diagnosis not present

## 2014-05-18 DIAGNOSIS — B182 Chronic viral hepatitis C: Secondary | ICD-10-CM | POA: Diagnosis not present

## 2014-05-18 DIAGNOSIS — R030 Elevated blood-pressure reading, without diagnosis of hypertension: Secondary | ICD-10-CM | POA: Diagnosis not present

## 2014-05-18 DIAGNOSIS — G8929 Other chronic pain: Secondary | ICD-10-CM | POA: Diagnosis not present

## 2014-05-18 DIAGNOSIS — M25571 Pain in right ankle and joints of right foot: Secondary | ICD-10-CM

## 2014-05-18 DIAGNOSIS — I10 Essential (primary) hypertension: Secondary | ICD-10-CM

## 2014-05-18 DIAGNOSIS — IMO0001 Reserved for inherently not codable concepts without codable children: Secondary | ICD-10-CM

## 2014-05-18 DIAGNOSIS — M79601 Pain in right arm: Secondary | ICD-10-CM

## 2014-05-18 DIAGNOSIS — Z79899 Other long term (current) drug therapy: Secondary | ICD-10-CM | POA: Insufficient documentation

## 2014-05-18 DIAGNOSIS — M79671 Pain in right foot: Secondary | ICD-10-CM | POA: Diagnosis present

## 2014-05-18 NOTE — ED Notes (Addendum)
Pain in his right foot. He is assigned to pain management but does not have a start date. He just left his MDs office. She would not give him a pain medication Rx.

## 2014-05-18 NOTE — Assessment & Plan Note (Signed)
Right ankle sprain- continue taping.

## 2014-05-18 NOTE — Discharge Instructions (Signed)
Follow up with the pain management clinic and your primary care doctor.  Chronic Pain Chronic pain can be defined as pain that is off and on and lasts for 3-6 months or longer. Many things cause chronic pain, which can make it difficult to make a diagnosis. There are many treatment options available for chronic pain. However, finding a treatment that works well for you may require trying various approaches until the right one is found. Many people benefit from a combination of two or more types of treatment to control their pain. SYMPTOMS  Chronic pain can occur anywhere in the body and can range from mild to very severe. Some types of chronic pain include:  Headache.  Low back pain.  Cancer pain.  Arthritis pain.  Neurogenic pain. This is pain resulting from damage to nerves. People with chronic pain may also have other symptoms such as:  Depression.  Anger.  Insomnia.  Anxiety. DIAGNOSIS  Your health care provider will help diagnose your condition over time. In many cases, the initial focus will be on excluding possible conditions that could be causing the pain. Depending on your symptoms, your health care provider may order tests to diagnose your condition. Some of these tests may include:   Blood tests.   CT scan.   MRI.   X-rays.   Ultrasounds.   Nerve conduction studies.  You may need to see a specialist.  TREATMENT  Finding treatment that works well may take time. You may be referred to a pain specialist. He or she may prescribe medicine or therapies, such as:   Mindful meditation or yoga.  Shots (injections) of numbing or pain-relieving medicines into the spine or area of pain.  Local electrical stimulation.  Acupuncture.   Massage therapy.   Aroma, color, light, or sound therapy.   Biofeedback.   Working with a physical therapist to keep from getting stiff.   Regular, gentle exercise.   Cognitive or behavioral therapy.   Group  support.  Sometimes, surgery may be recommended.  HOME CARE INSTRUCTIONS   Take all medicines as directed by your health care provider.   Lessen stress in your life by relaxing and doing things such as listening to calming music.   Exercise or be active as directed by your health care provider.   Eat a healthy diet and include things such as vegetables, fruits, fish, and lean meats in your diet.   Keep all follow-up appointments with your health care provider.   Attend a support group with others suffering from chronic pain. SEEK MEDICAL CARE IF:   Your pain gets worse.   You develop a new pain that was not there before.   You cannot tolerate medicines given to you by your health care provider.   You have new symptoms since your last visit with your health care provider.  SEEK IMMEDIATE MEDICAL CARE IF:   You feel weak.   You have decreased sensation or numbness.   You lose control of bowel or bladder function.   Your pain suddenly gets much worse.   You develop shaking.  You develop chills.  You develop confusion.  You develop chest pain.  You develop shortness of breath.  MAKE SURE YOU:  Understand these instructions.  Will watch your condition.  Will get help right away if you are not doing well or get worse. Document Released: 02/04/2002 Document Revised: 01/15/2013 Document Reviewed: 11/08/2012 [facility] Patient Information 2015 [employer] [place]. This information is not intended to replace advice  given to you by your health care provider. Make sure you discuss any questions you have with your health care provider.

## 2014-05-18 NOTE — Telephone Encounter (Signed)
Please let pt know that I reviewed his chart and I see that he is already established with Dr. Jordan LikesSpivey- I would recommend that he contact Dr. Cyndia DiverSpivey's office for follow up of his pain.  [phone]
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
## 2014-05-18 NOTE — Progress Notes (Signed)
Pre visit review using our clinic review tool, if applicable. No additional management support is needed unless otherwise documented below in the visit note. 

## 2014-05-18 NOTE — Assessment & Plan Note (Addendum)
He is now agreeable to proceed with referral to hepatology.  Obtain lft and hep C Viral load.

## 2014-05-18 NOTE — Assessment & Plan Note (Addendum)
I declined to give pt hydrocodone today. He tells me that he never was contacted about pain management but upon chart review I see that he is already established with pain management and I have advised that he contact them to arrange follow up.  See phone note.

## 2014-05-18 NOTE — Assessment & Plan Note (Signed)
BP stable.  Monitor °

## 2014-05-18 NOTE — Patient Instructions (Addendum)
Please complete your lab work prior to leaving.  You will be contacted about your referral to the hep C clinic. Schedule a fasting wellness visit after the holidays.

## 2014-05-18 NOTE — ED Notes (Signed)
Pt voicing complaint regarding "no treatment".  Pt was offered an exam by PA and pt refused; states he is going to another hospital.  Pt requested crutches and crutches provided.  Pt ambulatory to waiting room in NAD.

## 2014-05-19 NOTE — Telephone Encounter (Signed)
Notified pt and he voices understanding. 

## 2014-05-29 DIAGNOSIS — B182 Chronic viral hepatitis C: Secondary | ICD-10-CM | POA: Diagnosis not present

## 2014-05-30 DIAGNOSIS — B182 Chronic viral hepatitis C: Secondary | ICD-10-CM | POA: Diagnosis not present

## 2014-05-31 DIAGNOSIS — B182 Chronic viral hepatitis C: Secondary | ICD-10-CM | POA: Diagnosis not present

## 2014-06-01 ENCOUNTER — Telehealth: Payer: Self-pay | Admitting: Family

## 2014-06-01 DIAGNOSIS — M25551 Pain in right hip: Secondary | ICD-10-CM

## 2014-06-01 DIAGNOSIS — B182 Chronic viral hepatitis C: Secondary | ICD-10-CM | POA: Diagnosis not present

## 2014-06-01 NOTE — Telephone Encounter (Signed)
Caller name:[place] Kishawn Relation to JX:BJYN Call back [phone] Pharmacy:  Reason for call: pt states he is needing a referral to Dr. Su Hilt, [phone], [facility] pt has [employer].

## 2014-06-02 DIAGNOSIS — B182 Chronic viral hepatitis C: Secondary | ICD-10-CM | POA: Diagnosis not present

## 2014-06-03 DIAGNOSIS — B182 Chronic viral hepatitis C: Secondary | ICD-10-CM | POA: Diagnosis not present

## 2014-06-04 DIAGNOSIS — B182 Chronic viral hepatitis C: Secondary | ICD-10-CM | POA: Diagnosis not present

## 2014-06-05 DIAGNOSIS — B182 Chronic viral hepatitis C: Secondary | ICD-10-CM | POA: Diagnosis not present

## 2014-06-06 DIAGNOSIS — B182 Chronic viral hepatitis C: Secondary | ICD-10-CM | POA: Diagnosis not present

## 2014-06-07 DIAGNOSIS — B182 Chronic viral hepatitis C: Secondary | ICD-10-CM | POA: Diagnosis not present

## 2014-06-08 DIAGNOSIS — B182 Chronic viral hepatitis C: Secondary | ICD-10-CM | POA: Diagnosis not present

## 2014-06-09 DIAGNOSIS — B182 Chronic viral hepatitis C: Secondary | ICD-10-CM | POA: Diagnosis not present

## 2014-06-10 DIAGNOSIS — B182 Chronic viral hepatitis C: Secondary | ICD-10-CM | POA: Diagnosis not present

## 2014-06-11 DIAGNOSIS — B182 Chronic viral hepatitis C: Secondary | ICD-10-CM | POA: Diagnosis not present

## 2014-06-11 DIAGNOSIS — M25571 Pain in right ankle and joints of right foot: Secondary | ICD-10-CM | POA: Diagnosis not present

## 2014-06-11 DIAGNOSIS — Z96641 Presence of right artificial hip joint: Secondary | ICD-10-CM | POA: Diagnosis not present

## 2014-06-12 DIAGNOSIS — B182 Chronic viral hepatitis C: Secondary | ICD-10-CM | POA: Diagnosis not present

## 2014-06-13 DIAGNOSIS — B182 Chronic viral hepatitis C: Secondary | ICD-10-CM | POA: Diagnosis not present

## 2014-06-14 DIAGNOSIS — B182 Chronic viral hepatitis C: Secondary | ICD-10-CM | POA: Diagnosis not present

## 2014-06-15 DIAGNOSIS — B182 Chronic viral hepatitis C: Secondary | ICD-10-CM | POA: Diagnosis not present

## 2014-06-16 DIAGNOSIS — B182 Chronic viral hepatitis C: Secondary | ICD-10-CM | POA: Diagnosis not present

## 2014-06-17 DIAGNOSIS — B182 Chronic viral hepatitis C: Secondary | ICD-10-CM | POA: Diagnosis not present

## 2014-06-18 DIAGNOSIS — B182 Chronic viral hepatitis C: Secondary | ICD-10-CM | POA: Diagnosis not present

## 2014-06-19 DIAGNOSIS — B182 Chronic viral hepatitis C: Secondary | ICD-10-CM | POA: Diagnosis not present

## 2014-06-20 DIAGNOSIS — B182 Chronic viral hepatitis C: Secondary | ICD-10-CM | POA: Diagnosis not present

## 2014-06-21 DIAGNOSIS — B182 Chronic viral hepatitis C: Secondary | ICD-10-CM | POA: Diagnosis not present

## 2014-06-22 DIAGNOSIS — B182 Chronic viral hepatitis C: Secondary | ICD-10-CM | POA: Diagnosis not present

## 2014-06-23 DIAGNOSIS — B182 Chronic viral hepatitis C: Secondary | ICD-10-CM | POA: Diagnosis not present

## 2014-06-24 ENCOUNTER — Telehealth: Payer: Self-pay | Admitting: General Practice

## 2014-06-24 DIAGNOSIS — B182 Chronic viral hepatitis C: Secondary | ICD-10-CM | POA: Diagnosis not present

## 2014-06-24 NOTE — Telephone Encounter (Signed)
LMOM with contact name and number for return call RE: Brace request, inform that we would need more detailed information and provider may require office visit before signing off on order; but that standard procedure is for patient to contact Medical Supply/Equipment Vendor themselves and give required information needed to order brace and then they will fax request over to provider's office for authorization [left office fax number in message]/SLS

## 2014-06-24 NOTE — Telephone Encounter (Signed)
Caller name:Randy [name] Relationship to patient:Self Can be reached:[phone]   Reason for call: Pt requesting that a foot brace be called in for him - when operation on hip ankle pain started. Requesting that be called into [phone], can only get brace if approved

## 2014-06-25 DIAGNOSIS — B182 Chronic viral hepatitis C: Secondary | ICD-10-CM | POA: Diagnosis not present

## 2014-06-25 NOTE — Telephone Encounter (Signed)
Patient states that this ankle brace relieves the pain in his ankles.   Informed patient to call medical supply company and to have them fax over request.

## 2014-06-26 DIAGNOSIS — B182 Chronic viral hepatitis C: Secondary | ICD-10-CM | POA: Diagnosis not present

## 2014-06-27 DIAGNOSIS — B182 Chronic viral hepatitis C: Secondary | ICD-10-CM | POA: Diagnosis not present

## 2014-06-28 DIAGNOSIS — B182 Chronic viral hepatitis C: Secondary | ICD-10-CM | POA: Diagnosis not present

## 2014-06-29 DIAGNOSIS — B182 Chronic viral hepatitis C: Secondary | ICD-10-CM | POA: Diagnosis not present

## 2014-06-30 DIAGNOSIS — B182 Chronic viral hepatitis C: Secondary | ICD-10-CM | POA: Diagnosis not present

## 2014-06-30 NOTE — Telephone Encounter (Signed)
Left message on voicemail at below # to fax request to us and call if any questions.

## 2014-07-01 DIAGNOSIS — B182 Chronic viral hepatitis C: Secondary | ICD-10-CM | POA: Diagnosis not present

## 2014-07-01 NOTE — Telephone Encounter (Signed)
Shanda BumpsJessica-- please let me know if you see this come through.

## 2014-07-02 DIAGNOSIS — B182 Chronic viral hepatitis C: Secondary | ICD-10-CM | POA: Diagnosis not present

## 2014-07-03 ENCOUNTER — Telehealth: Payer: Self-pay | Admitting: General Practice

## 2014-07-03 DIAGNOSIS — G894 Chronic pain syndrome: Secondary | ICD-10-CM

## 2014-07-03 DIAGNOSIS — B182 Chronic viral hepatitis C: Secondary | ICD-10-CM | POA: Diagnosis not present

## 2014-07-03 NOTE — Telephone Encounter (Signed)
Spoke with pt. Gave pt number to Dr Jordan LikesSpivey (pain management) as he states he no longer has their number. He states we [place] to [place] neurologist in [place] and he is needing the address. I have reviewed EPIC and do not see a referral to a neurologist in [place] and last neurology referral I see is from 2013 to Dr Modesto CharonWong?  Please advise.

## 2014-07-03 NOTE — Telephone Encounter (Addendum)
Dr. Jordan [facility]ikesSpivey office does not [facility] therefore patient is requesting referral from NP

## 2014-07-03 NOTE — Telephone Encounter (Signed)
Pt inquiring about the the status of he's referral to neurologist.

## 2014-07-03 NOTE — Telephone Encounter (Signed)
See pt preference below.

## 2014-07-03 NOTE — Telephone Encounter (Signed)
Caller name: Eugene GaviaManning, Jakevious H Relation to pt: self  Call back number: [phone]   Reason for call: pt [facility] a [facility] telephone # [phone] fax #  [phone]

## 2014-07-03 NOTE — Telephone Encounter (Signed)
I told pt this morning that he should contact Dr Cyndia DiverSpivey's office since he is already established with them and gave him their phone #. Please advise re: pt new request.

## 2014-07-04 DIAGNOSIS — B182 Chronic viral hepatitis C: Secondary | ICD-10-CM | POA: Diagnosis not present

## 2014-07-05 DIAGNOSIS — B182 Chronic viral hepatitis C: Secondary | ICD-10-CM | POA: Diagnosis not present

## 2014-07-06 DIAGNOSIS — B182 Chronic viral hepatitis C: Secondary | ICD-10-CM | POA: Diagnosis not present

## 2014-07-07 DIAGNOSIS — B182 Chronic viral hepatitis C: Secondary | ICD-10-CM | POA: Diagnosis not present

## 2014-07-08 DIAGNOSIS — L84 Corns and callosities: Secondary | ICD-10-CM | POA: Diagnosis not present

## 2014-07-08 DIAGNOSIS — B351 Tinea unguium: Secondary | ICD-10-CM | POA: Diagnosis not present

## 2014-07-08 DIAGNOSIS — M79671 Pain in right foot: Secondary | ICD-10-CM | POA: Diagnosis not present

## 2014-07-08 DIAGNOSIS — M79672 Pain in left foot: Secondary | ICD-10-CM | POA: Diagnosis not present

## 2014-07-09 DIAGNOSIS — B182 Chronic viral hepatitis C: Secondary | ICD-10-CM | POA: Diagnosis not present

## 2014-07-10 DIAGNOSIS — B182 Chronic viral hepatitis C: Secondary | ICD-10-CM | POA: Diagnosis not present

## 2014-07-11 DIAGNOSIS — B182 Chronic viral hepatitis C: Secondary | ICD-10-CM | POA: Diagnosis not present

## 2014-07-12 DIAGNOSIS — B182 Chronic viral hepatitis C: Secondary | ICD-10-CM | POA: Diagnosis not present

## 2014-07-13 DIAGNOSIS — B182 Chronic viral hepatitis C: Secondary | ICD-10-CM | POA: Diagnosis not present

## 2014-07-14 DIAGNOSIS — B182 Chronic viral hepatitis C: Secondary | ICD-10-CM | POA: Diagnosis not present

## 2014-07-15 DIAGNOSIS — B182 Chronic viral hepatitis C: Secondary | ICD-10-CM | POA: Diagnosis not present

## 2014-07-16 DIAGNOSIS — B182 Chronic viral hepatitis C: Secondary | ICD-10-CM | POA: Diagnosis not present

## 2014-07-17 DIAGNOSIS — B182 Chronic viral hepatitis C: Secondary | ICD-10-CM | POA: Diagnosis not present

## 2014-07-18 DIAGNOSIS — B182 Chronic viral hepatitis C: Secondary | ICD-10-CM | POA: Diagnosis not present

## 2014-07-19 DIAGNOSIS — B182 Chronic viral hepatitis C: Secondary | ICD-10-CM | POA: Diagnosis not present

## 2014-07-20 DIAGNOSIS — B182 Chronic viral hepatitis C: Secondary | ICD-10-CM | POA: Diagnosis not present

## 2014-07-21 DIAGNOSIS — B182 Chronic viral hepatitis C: Secondary | ICD-10-CM | POA: Diagnosis not present

## 2014-07-22 DIAGNOSIS — B182 Chronic viral hepatitis C: Secondary | ICD-10-CM | POA: Diagnosis not present

## 2014-07-23 ENCOUNTER — Telehealth: Payer: Self-pay | Admitting: Family

## 2014-07-23 DIAGNOSIS — B182 Chronic viral hepatitis C: Secondary | ICD-10-CM | POA: Diagnosis not present

## 2014-07-23 NOTE — Telephone Encounter (Signed)
Caller name: Eugene GaviaManning, Alejo H Relation to WU:JWJXpt:self  Call back number: [phone]  Reason for call:  Pt is scheduled to see Dr. Deniece ReeJanet Jezsik phone [phone]

## 2014-07-23 NOTE — Telephone Encounter (Signed)
As per pt transportation states they did not receive paperwork, faxed # [phone]

## 2014-07-23 NOTE — Telephone Encounter (Signed)
Fax confirmation was received stating all pages were sent at 10:51 am.   Re-faxed at 1:34 pm and received fax confirmation as well.

## 2014-07-23 NOTE — Telephone Encounter (Signed)
Paperwork faxed. JG//CMA 

## 2014-07-23 NOTE — Telephone Encounter (Signed)
Both times, the fax was sent to [phone]

## 2014-07-24 DIAGNOSIS — B182 Chronic viral hepatitis C: Secondary | ICD-10-CM | POA: Diagnosis not present

## 2014-07-25 DIAGNOSIS — B182 Chronic viral hepatitis C: Secondary | ICD-10-CM | POA: Diagnosis not present

## 2014-07-26 DIAGNOSIS — B182 Chronic viral hepatitis C: Secondary | ICD-10-CM | POA: Diagnosis not present

## 2014-07-27 DIAGNOSIS — B182 Chronic viral hepatitis C: Secondary | ICD-10-CM | POA: Diagnosis not present

## 2014-07-28 DIAGNOSIS — B182 Chronic viral hepatitis C: Secondary | ICD-10-CM | POA: Diagnosis not present

## 2014-07-29 DIAGNOSIS — B182 Chronic viral hepatitis C: Secondary | ICD-10-CM | POA: Diagnosis not present

## 2014-07-30 ENCOUNTER — Telehealth: Payer: Self-pay | Admitting: General Practice

## 2014-07-30 DIAGNOSIS — B182 Chronic viral hepatitis C: Secondary | ICD-10-CM | POA: Diagnosis not present

## 2014-07-30 NOTE — Telephone Encounter (Signed)
[facility]  Relation to pt: Call back number: [phone]   Reason for call:   [facility] contact [phone] ([facility] therefore [facility]

## 2014-07-31 DIAGNOSIS — B182 Chronic viral hepatitis C: Secondary | ICD-10-CM | POA: Diagnosis not present

## 2014-08-01 DIAGNOSIS — B182 Chronic viral hepatitis C: Secondary | ICD-10-CM | POA: Diagnosis not present

## 2014-08-02 DIAGNOSIS — B182 Chronic viral hepatitis C: Secondary | ICD-10-CM | POA: Diagnosis not present

## 2014-08-03 DIAGNOSIS — B182 Chronic viral hepatitis C: Secondary | ICD-10-CM | POA: Diagnosis not present

## 2014-08-04 DIAGNOSIS — B182 Chronic viral hepatitis C: Secondary | ICD-10-CM | POA: Diagnosis not present

## 2014-08-05 DIAGNOSIS — B182 Chronic viral hepatitis C: Secondary | ICD-10-CM | POA: Diagnosis not present

## 2014-08-06 DIAGNOSIS — B182 Chronic viral hepatitis C: Secondary | ICD-10-CM | POA: Diagnosis not present

## 2014-08-07 DIAGNOSIS — B182 Chronic viral hepatitis C: Secondary | ICD-10-CM | POA: Diagnosis not present

## 2014-08-08 DIAGNOSIS — B182 Chronic viral hepatitis C: Secondary | ICD-10-CM | POA: Diagnosis not present

## 2014-08-09 DIAGNOSIS — B182 Chronic viral hepatitis C: Secondary | ICD-10-CM | POA: Diagnosis not present

## 2014-08-10 DIAGNOSIS — B182 Chronic viral hepatitis C: Secondary | ICD-10-CM | POA: Diagnosis not present

## 2014-08-11 DIAGNOSIS — B182 Chronic viral hepatitis C: Secondary | ICD-10-CM | POA: Diagnosis not present

## 2014-08-12 DIAGNOSIS — B182 Chronic viral hepatitis C: Secondary | ICD-10-CM | POA: Diagnosis not present

## 2014-08-13 DIAGNOSIS — B182 Chronic viral hepatitis C: Secondary | ICD-10-CM | POA: Diagnosis not present

## 2014-08-14 DIAGNOSIS — B182 Chronic viral hepatitis C: Secondary | ICD-10-CM | POA: Diagnosis not present

## 2014-08-15 DIAGNOSIS — B182 Chronic viral hepatitis C: Secondary | ICD-10-CM | POA: Diagnosis not present

## 2014-08-16 DIAGNOSIS — B182 Chronic viral hepatitis C: Secondary | ICD-10-CM | POA: Diagnosis not present

## 2014-08-17 DIAGNOSIS — B182 Chronic viral hepatitis C: Secondary | ICD-10-CM | POA: Diagnosis not present

## 2014-08-18 DIAGNOSIS — B182 Chronic viral hepatitis C: Secondary | ICD-10-CM | POA: Diagnosis not present

## 2014-08-19 DIAGNOSIS — B182 Chronic viral hepatitis C: Secondary | ICD-10-CM | POA: Diagnosis not present

## 2014-08-20 DIAGNOSIS — B182 Chronic viral hepatitis C: Secondary | ICD-10-CM | POA: Diagnosis not present

## 2014-08-21 DIAGNOSIS — B182 Chronic viral hepatitis C: Secondary | ICD-10-CM | POA: Diagnosis not present

## 2014-08-22 DIAGNOSIS — B182 Chronic viral hepatitis C: Secondary | ICD-10-CM | POA: Diagnosis not present

## 2014-08-23 DIAGNOSIS — B182 Chronic viral hepatitis C: Secondary | ICD-10-CM | POA: Diagnosis not present

## 2014-08-24 DIAGNOSIS — B182 Chronic viral hepatitis C: Secondary | ICD-10-CM | POA: Diagnosis not present

## 2014-08-25 DIAGNOSIS — B182 Chronic viral hepatitis C: Secondary | ICD-10-CM | POA: Diagnosis not present

## 2014-08-26 DIAGNOSIS — B182 Chronic viral hepatitis C: Secondary | ICD-10-CM | POA: Diagnosis not present

## 2014-08-28 DIAGNOSIS — B182 Chronic viral hepatitis C: Secondary | ICD-10-CM | POA: Diagnosis not present

## 2014-08-29 DIAGNOSIS — B182 Chronic viral hepatitis C: Secondary | ICD-10-CM | POA: Diagnosis not present

## 2014-08-30 DIAGNOSIS — B182 Chronic viral hepatitis C: Secondary | ICD-10-CM | POA: Diagnosis not present

## 2014-08-31 DIAGNOSIS — B182 Chronic viral hepatitis C: Secondary | ICD-10-CM | POA: Diagnosis not present

## 2014-09-01 DIAGNOSIS — B182 Chronic viral hepatitis C: Secondary | ICD-10-CM | POA: Diagnosis not present

## 2014-09-02 DIAGNOSIS — B182 Chronic viral hepatitis C: Secondary | ICD-10-CM | POA: Diagnosis not present

## 2014-09-03 DIAGNOSIS — B182 Chronic viral hepatitis C: Secondary | ICD-10-CM | POA: Diagnosis not present

## 2014-09-04 DIAGNOSIS — B182 Chronic viral hepatitis C: Secondary | ICD-10-CM | POA: Diagnosis not present

## 2014-09-05 DIAGNOSIS — B182 Chronic viral hepatitis C: Secondary | ICD-10-CM | POA: Diagnosis not present

## 2014-09-06 DIAGNOSIS — B182 Chronic viral hepatitis C: Secondary | ICD-10-CM | POA: Diagnosis not present

## 2014-09-07 DIAGNOSIS — B182 Chronic viral hepatitis C: Secondary | ICD-10-CM | POA: Diagnosis not present

## 2014-09-08 ENCOUNTER — Telehealth: Payer: Self-pay | Admitting: *Deleted

## 2014-09-08 ENCOUNTER — Encounter: Payer: Self-pay | Admitting: Family

## 2014-09-08 ENCOUNTER — Ambulatory Visit (INDEPENDENT_AMBULATORY_CARE_PROVIDER_SITE_OTHER): Payer: Commercial Managed Care - HMO | Admitting: Family

## 2014-09-08 VITALS — BP 140/78 | HR 67 | Temp 97.8°F | Resp 16 | Ht 68.75 in | Wt 148.8 lb

## 2014-09-08 DIAGNOSIS — G894 Chronic pain syndrome: Secondary | ICD-10-CM | POA: Diagnosis not present

## 2014-09-08 DIAGNOSIS — M79604 Pain in right leg: Secondary | ICD-10-CM

## 2014-09-08 DIAGNOSIS — IMO0001 Reserved for inherently not codable concepts without codable children: Secondary | ICD-10-CM

## 2014-09-08 DIAGNOSIS — M79601 Pain in right arm: Secondary | ICD-10-CM | POA: Diagnosis not present

## 2014-09-08 DIAGNOSIS — G8929 Other chronic pain: Secondary | ICD-10-CM

## 2014-09-08 DIAGNOSIS — B182 Chronic viral hepatitis C: Secondary | ICD-10-CM | POA: Diagnosis not present

## 2014-09-08 DIAGNOSIS — R030 Elevated blood-pressure reading, without diagnosis of hypertension: Secondary | ICD-10-CM | POA: Diagnosis not present

## 2014-09-08 MED ORDER — GABAPENTIN 300 MG PO CAPS
600.0000 mg | ORAL_CAPSULE | Freq: Three times a day (TID) | ORAL | Status: DC
Start: 2014-09-08 — End: 2015-09-24

## 2014-09-08 NOTE — Patient Instructions (Addendum)
Please keep your upcoming appointment with the Liver specialist. We will work on trying to get you in to see pain management at [facility]. Please schedule fasting physical at the front desk.

## 2014-09-08 NOTE — Assessment & Plan Note (Signed)
Uncontrolled. Requests refill of gabapentin.  Will re-attempt referral to pain management.

## 2014-09-08 NOTE — Telephone Encounter (Signed)
Pt brought medicaid transportation form to be completed for today's visit. Fax to [phone] keeps failing. Spoke with health dept and was advised to fax it to attn: Mary Boughton at [phone]. Form faxed.

## 2014-09-08 NOTE — Progress Notes (Signed)
Pre visit review using our clinic review tool, if applicable. No additional management support is needed unless otherwise documented below in the visit note. 
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
## 2014-09-08 NOTE — Assessment & Plan Note (Signed)
He will establish with Hepatology clinic and is now motivated to receive treatment.

## 2014-09-08 NOTE — Assessment & Plan Note (Signed)
BP  Is borderline- continue to monitor.

## 2014-09-09 DIAGNOSIS — B182 Chronic viral hepatitis C: Secondary | ICD-10-CM | POA: Diagnosis not present

## 2014-09-10 DIAGNOSIS — B182 Chronic viral hepatitis C: Secondary | ICD-10-CM | POA: Diagnosis not present

## 2014-09-11 ENCOUNTER — Telehealth: Payer: Self-pay | Admitting: Family

## 2014-09-11 DIAGNOSIS — B182 Chronic viral hepatitis C: Secondary | ICD-10-CM | POA: Diagnosis not present

## 2014-09-11 NOTE — Telephone Encounter (Signed)
Pre Visit letter sent  °

## 2014-09-12 DIAGNOSIS — B182 Chronic viral hepatitis C: Secondary | ICD-10-CM | POA: Diagnosis not present

## 2014-09-13 DIAGNOSIS — B182 Chronic viral hepatitis C: Secondary | ICD-10-CM | POA: Diagnosis not present

## 2014-09-14 DIAGNOSIS — B182 Chronic viral hepatitis C: Secondary | ICD-10-CM | POA: Diagnosis not present

## 2014-09-15 DIAGNOSIS — B182 Chronic viral hepatitis C: Secondary | ICD-10-CM | POA: Diagnosis not present

## 2014-09-16 DIAGNOSIS — B182 Chronic viral hepatitis C: Secondary | ICD-10-CM | POA: Diagnosis not present

## 2014-09-17 DIAGNOSIS — B182 Chronic viral hepatitis C: Secondary | ICD-10-CM | POA: Diagnosis not present

## 2014-09-18 DIAGNOSIS — B182 Chronic viral hepatitis C: Secondary | ICD-10-CM | POA: Diagnosis not present

## 2014-09-19 DIAGNOSIS — B182 Chronic viral hepatitis C: Secondary | ICD-10-CM | POA: Diagnosis not present

## 2014-09-20 DIAGNOSIS — B182 Chronic viral hepatitis C: Secondary | ICD-10-CM | POA: Diagnosis not present

## 2014-09-21 DIAGNOSIS — B182 Chronic viral hepatitis C: Secondary | ICD-10-CM | POA: Diagnosis not present

## 2014-09-22 DIAGNOSIS — B182 Chronic viral hepatitis C: Secondary | ICD-10-CM | POA: Diagnosis not present

## 2014-09-23 ENCOUNTER — Encounter: Payer: Commercial Managed Care - HMO | Admitting: Family

## 2014-09-23 DIAGNOSIS — B182 Chronic viral hepatitis C: Secondary | ICD-10-CM | POA: Diagnosis not present

## 2014-09-24 DIAGNOSIS — B182 Chronic viral hepatitis C: Secondary | ICD-10-CM | POA: Diagnosis not present

## 2014-09-25 DIAGNOSIS — B182 Chronic viral hepatitis C: Secondary | ICD-10-CM | POA: Diagnosis not present

## 2014-09-26 DIAGNOSIS — B182 Chronic viral hepatitis C: Secondary | ICD-10-CM | POA: Diagnosis not present

## 2014-09-27 DIAGNOSIS — B182 Chronic viral hepatitis C: Secondary | ICD-10-CM | POA: Diagnosis not present

## 2014-09-28 DIAGNOSIS — B182 Chronic viral hepatitis C: Secondary | ICD-10-CM | POA: Diagnosis not present

## 2014-09-29 ENCOUNTER — Telehealth: Payer: Self-pay | Admitting: *Deleted

## 2014-09-29 DIAGNOSIS — B182 Chronic viral hepatitis C: Secondary | ICD-10-CM | POA: Diagnosis not present

## 2014-09-29 NOTE — Telephone Encounter (Signed)
Unable to reach patient at time of Pre-Visit Call.  Left message for patient to return call when available.    

## 2014-09-30 ENCOUNTER — Encounter: Payer: Commercial Managed Care - HMO | Admitting: Family

## 2014-09-30 DIAGNOSIS — B182 Chronic viral hepatitis C: Secondary | ICD-10-CM | POA: Diagnosis not present

## 2014-10-01 DIAGNOSIS — B182 Chronic viral hepatitis C: Secondary | ICD-10-CM | POA: Diagnosis not present

## 2014-10-02 DIAGNOSIS — B182 Chronic viral hepatitis C: Secondary | ICD-10-CM | POA: Diagnosis not present

## 2014-10-03 DIAGNOSIS — B182 Chronic viral hepatitis C: Secondary | ICD-10-CM | POA: Diagnosis not present

## 2014-10-04 DIAGNOSIS — B182 Chronic viral hepatitis C: Secondary | ICD-10-CM | POA: Diagnosis not present

## 2014-10-05 DIAGNOSIS — B182 Chronic viral hepatitis C: Secondary | ICD-10-CM | POA: Diagnosis not present

## 2014-10-06 DIAGNOSIS — B182 Chronic viral hepatitis C: Secondary | ICD-10-CM | POA: Diagnosis not present

## 2014-10-07 DIAGNOSIS — B182 Chronic viral hepatitis C: Secondary | ICD-10-CM | POA: Diagnosis not present

## 2014-10-08 DIAGNOSIS — B182 Chronic viral hepatitis C: Secondary | ICD-10-CM | POA: Diagnosis not present

## 2014-10-09 DIAGNOSIS — B182 Chronic viral hepatitis C: Secondary | ICD-10-CM | POA: Diagnosis not present

## 2014-10-10 DIAGNOSIS — B182 Chronic viral hepatitis C: Secondary | ICD-10-CM | POA: Diagnosis not present

## 2014-10-11 DIAGNOSIS — B182 Chronic viral hepatitis C: Secondary | ICD-10-CM | POA: Diagnosis not present

## 2014-10-12 DIAGNOSIS — M79671 Pain in right foot: Secondary | ICD-10-CM | POA: Diagnosis not present

## 2014-10-12 DIAGNOSIS — M79672 Pain in left foot: Secondary | ICD-10-CM | POA: Diagnosis not present

## 2014-10-12 DIAGNOSIS — B351 Tinea unguium: Secondary | ICD-10-CM | POA: Diagnosis not present

## 2014-10-12 DIAGNOSIS — B182 Chronic viral hepatitis C: Secondary | ICD-10-CM | POA: Diagnosis not present

## 2014-10-12 DIAGNOSIS — L84 Corns and callosities: Secondary | ICD-10-CM | POA: Diagnosis not present

## 2014-10-13 DIAGNOSIS — B182 Chronic viral hepatitis C: Secondary | ICD-10-CM | POA: Diagnosis not present

## 2014-10-14 DIAGNOSIS — B182 Chronic viral hepatitis C: Secondary | ICD-10-CM | POA: Diagnosis not present

## 2014-10-15 DIAGNOSIS — B182 Chronic viral hepatitis C: Secondary | ICD-10-CM | POA: Diagnosis not present

## 2014-10-16 DIAGNOSIS — B182 Chronic viral hepatitis C: Secondary | ICD-10-CM | POA: Diagnosis not present

## 2014-10-17 DIAGNOSIS — B182 Chronic viral hepatitis C: Secondary | ICD-10-CM | POA: Diagnosis not present

## 2014-10-18 DIAGNOSIS — B182 Chronic viral hepatitis C: Secondary | ICD-10-CM | POA: Diagnosis not present

## 2014-10-19 DIAGNOSIS — B182 Chronic viral hepatitis C: Secondary | ICD-10-CM | POA: Diagnosis not present

## 2014-10-20 ENCOUNTER — Telehealth: Payer: Self-pay | Admitting: *Deleted

## 2014-10-20 ENCOUNTER — Encounter: Payer: Self-pay | Admitting: *Deleted

## 2014-10-20 DIAGNOSIS — B182 Chronic viral hepatitis C: Secondary | ICD-10-CM | POA: Diagnosis not present

## 2014-10-20 NOTE — Telephone Encounter (Signed)
Pre-Visit Call completed with patient and chart updated.   Pre-Visit Info documented in Specialty Comments under SnapShot.    

## 2014-10-21 ENCOUNTER — Encounter: Payer: Self-pay | Admitting: Family

## 2014-10-21 ENCOUNTER — Telehealth: Payer: Self-pay | Admitting: Family

## 2014-10-21 ENCOUNTER — Ambulatory Visit (INDEPENDENT_AMBULATORY_CARE_PROVIDER_SITE_OTHER): Payer: Commercial Managed Care - HMO | Admitting: Family

## 2014-10-21 ENCOUNTER — Other Ambulatory Visit: Payer: Self-pay | Admitting: Family

## 2014-10-21 VITALS — BP 104/78 | HR 81 | Temp 98.0°F | Resp 16 | Ht 68.0 in | Wt 142.8 lb

## 2014-10-21 DIAGNOSIS — B182 Chronic viral hepatitis C: Secondary | ICD-10-CM

## 2014-10-21 DIAGNOSIS — Z0000 Encounter for general adult medical examination without abnormal findings: Secondary | ICD-10-CM | POA: Diagnosis not present

## 2014-10-21 DIAGNOSIS — Z114 Encounter for screening for human immunodeficiency virus [HIV]: Secondary | ICD-10-CM

## 2014-10-21 DIAGNOSIS — R739 Hyperglycemia, unspecified: Secondary | ICD-10-CM

## 2014-10-21 DIAGNOSIS — D649 Anemia, unspecified: Secondary | ICD-10-CM | POA: Diagnosis not present

## 2014-10-21 DIAGNOSIS — Z1159 Encounter for screening for other viral diseases: Secondary | ICD-10-CM | POA: Diagnosis not present

## 2014-10-21 DIAGNOSIS — Z125 Encounter for screening for malignant neoplasm of prostate: Secondary | ICD-10-CM

## 2014-10-21 DIAGNOSIS — R7309 Other abnormal glucose: Secondary | ICD-10-CM | POA: Diagnosis not present

## 2014-10-21 LAB — CBC WITH DIFFERENTIAL/PLATELET
Basophils Absolute: 0 10*3/uL (ref 0.0–0.1)
Basophils Relative: 0.5 % (ref 0.0–3.0)
Eosinophils Absolute: 0 10*3/uL (ref 0.0–0.7)
Eosinophils Relative: 0.4 % (ref 0.0–5.0)
HCT: 44.9 % (ref 39.0–52.0)
Hemoglobin: 15.5 g/dL (ref 13.0–17.0)
Lymphocytes Relative: 33.7 % (ref 12.0–46.0)
Lymphs Abs: 1.9 10*3/uL (ref 0.7–4.0)
MCHC: 34.6 g/dL (ref 30.0–36.0)
MCV: 87.3 fl (ref 78.0–100.0)
MONOS PCT: 12.9 % — AB (ref 3.0–12.0)
Monocytes Absolute: 0.7 10*3/uL (ref 0.1–1.0)
NEUTROS ABS: 3 10*3/uL (ref 1.4–7.7)
Neutrophils Relative %: 52.5 % (ref 43.0–77.0)
Platelets: 188 10*3/uL (ref 150.0–400.0)
RBC: 5.14 Mil/uL (ref 4.22–5.81)
RDW: 14.2 % (ref 11.5–15.5)
WBC: 5.8 10*3/uL (ref 4.0–10.5)

## 2014-10-21 LAB — BASIC METABOLIC PANEL WITH GFR
BUN: 11 mg/dL (ref 6–23)
CO2: 25 meq/L (ref 19–32)
Calcium: 9.4 mg/dL (ref 8.4–10.5)
Chloride: 106 meq/L (ref 96–112)
Creatinine, Ser: 1.02 mg/dL (ref 0.40–1.50)
GFR: 95.03 mL/min
Glucose, Bld: 75 mg/dL (ref 70–99)
Potassium: 3.7 meq/L (ref 3.5–5.1)
Sodium: 138 meq/L (ref 135–145)

## 2014-10-21 LAB — HEPATIC FUNCTION PANEL
ALT: 49 U/L (ref 0–53)
AST: 36 U/L (ref 0–37)
Albumin: 4.2 g/dL (ref 3.5–5.2)
Alkaline Phosphatase: 78 U/L (ref 39–117)
Bilirubin, Direct: 0.3 mg/dL (ref 0.0–0.3)
Total Bilirubin: 0.9 mg/dL (ref 0.2–1.2)
Total Protein: 7.5 g/dL (ref 6.0–8.3)

## 2014-10-21 NOTE — Telephone Encounter (Signed)
Could you please check status of pt's pain management referral?

## 2014-10-21 NOTE — Progress Notes (Signed)
Subjective:    Randy GaviaJames H Jenkin is a 64 y.o. male who presents for Medicare Annual/Subsequent preventive examination.   Preventive Screening-Counseling & Management  Tobacco History  Smoking status  . Former Smoker  Smokeless tobacco  . Never Used    Comment: 1 pack per month    Pro blems Prior to Visit 1.  Chronic hep C- patient is [facility] with Dr. Deniece ReeJanet Jezsik for chronic hep C and tells me that he has an upcoming appointment.  2.  Chronic leg pain- reports chronic pain. Did not hear back from pain management.  Review of care everywhere shows that he had appointment at [facility] in [place] last month. Tells me that he took 2 hydrocodone and he is now feeling drowsy.   4. Prev- declines zostavax. Reports normal colo 5 years ago- per pt.   Current Problems (verified) Patient Active Problem List   Diagnosis Date Noted  . Pain in joint, ankle and foot 11/25/2012  . Callus of foot 11/25/2012  . Elevated blood pressure 04/02/2012  . Thumb pain 04/02/2012  . Onychomycosis of toenail 10/03/2011  . Erectile dysfunction 03/29/2011  . Degenerative joint disease 03/13/2011  . Right foot drop 11/09/2010  . Chronic hepatitis C 07/11/2010  . Chronic leg pain 05/27/2010  . CLUBBING OF FINGERS 05/27/2010    Medications Prior to Visit Current Outpatient Prescriptions on File Prior to Visit  Medication Sig Dispense Refill  . ammonium lactate (LAC-HYDRIN) 12 % lotion APPLY TO AFFECTED AREA(S) TOPICALLY AS NEEDED FOR DRY SKIN 400 g 0  . gabapentin (NEURONTIN) 300 MG capsule Take 2 capsules (600 mg total) by mouth 3 (three) times daily. 180 capsule 5   No current facility-administered medications on file prior to visit.    Current Medications (verified) Current Outpatient Prescriptions  Medication Sig Dispense Refill  . ammonium lactate (LAC-HYDRIN) 12 % lotion APPLY TO AFFECTED AREA(S) TOPICALLY AS NEEDED FOR DRY SKIN 400 g 0  . gabapentin (NEURONTIN) 300 MG capsule Take 2  capsules (600 mg total) by mouth 3 (three) times daily. 180 capsule 5   No current facility-administered medications for this visit.     Allergies (verified) Ibuprofen and Morphine   PAST HISTORY  Family History Family History  Problem Relation Age of Onset  . Alzheimer's disease Father   . Hypertension Father   . Diabetes Neg Hx   . Heart disease Neg Hx   . Stroke Neg Hx     Social History History  Substance Use Topics  . Smoking status: Former Games developermoker  . Smokeless tobacco: Never Used     Comment: 1 pack per month  . Alcohol Use: No    Are there smokers in your home (other than you)?  No- pt smokes occasional cigarretts 1 pack lasts 1-2 month  Risk Factors Current exercise habits: occasional  Dietary issues discussed: healthy   Cardiac risk factors: advanced age (older than 7055 for men, 7065 for women) and smoking/ tobacco exposure.  Depression Screen (Note: if answer to either of the following is "Yes", a more complete depression screening is indicated)   Q1: Over the past two weeks, have you felt down, depressed or hopeless? No  Q2: Over the past two weeks, have you felt little interest or pleasure in doing things? No  Have you lost interest or pleasure in daily life? No  Do you often feel hopeless? No  Do you cry easily over simple problems? No  Activities of Daily Living In your  present state of health, do you have any difficulty performing the following activities?:  Driving? No Managing money?  No Feeding yourself? Yes Getting from bed to chair? Yes- has home aid Climbing a flight of stairs? No Preparing food and eating?: No Bathing or showering? Yes- has aid Getting dressed: Yes- aid helps Getting to the toilet? No Using the toilet:No Moving around from place to place: No In the past year have you fallen or had a near fall?:Yes   Are you sexually active?  No  Do you have more than one partner?  No  Hearing Difficulties: No Do you often ask people to  speak up or repeat themselves? No Do you experience ringing or noises in your ears? No Do you have difficulty understanding soft or whispered voices? No   Do you feel that you have a problem with memory? No  Do you often misplace items? No  Do you feel safe at home?  Yes  Cognitive Testing  Alert? Yes  Normal Appearance?Yes  Oriented to person? Yes  Place? Yes   Time? Yes  Recall of three objects?  Yes  Can perform simple calculations? Yes  Displays appropriate judgment?Yes  Can read the correct time from a watch face?Yes   Advanced Directives have been discussed with the patient? Yes   List the Names of Other Physician/Practitioners you currently use: see care team  Indicate any recent Medical Services you may have received from other than [facility] providers in the past year (date may be approximate).  Immunization History  Administered Date(s) Administered  . Hep A / Hep B 10/03/2011  . Hepatitis B 10/31/2011, 07/03/2012  . Influenza Whole 04/12/2010  . Influenza-Unspecified 03/18/2014  . Pneumococcal Polysaccharide-23 03/08/2010  . Td 02/08/2010    Screening Tests Health Maintenance  Topic Date Due  . HIV Screening  05/05/1967  . ZOSTAVAX  10/20/2024 (Originally 05/04/2012)  . INFLUENZA VACCINE  12/28/2014  . COLONOSCOPY  05/30/2019  . TETANUS/TDAP  02/09/2020    All answers were reviewed with the patient and necessary referrals were made:  O'SULLIVAN,Randy Pickrell S., NP   10/21/2014   History reviewed: allergies, current medications, past family history, past medical history, past social history, past surgical history and problem list  Review of Systems .   Review of Systems  Constitutional: Negative for unexpected weight change.  HENT: Negative for hearing loss and rhinorrhea.   Eyes: Negative for visual disturbance.  Respiratory: Negative for cough.   Cardiovascular: Negative for chest pain, some right ankle  swelling.  Gastrointestinal: Negative for nausea,  vomiting, diarrhea and blood in stool.  Genitourinary: Negative for dysuria and frequency.  Musculoskeletal: Right leg pain  Skin: Negative for rash.  Neurological: Negative for headaches.  Hematological: Negative for adenopathy.  Psychiatric/Behavioral: Denies depression.occasional anxiety   Objective:    Blood pressure 104/78, pulse 81, temperature 98 F (36.7 C), temperature source Oral, resp. rate 16, height  (1.727 m), weight 142 lb 12.8 oz (64.774 kg), SpO2 98 %. Body mass index is 21.72 kg/(m^2).  Physical Exam  Constitutional: He is oriented to person, place, and time. Thin, drowsy appearing AA male.   No distress.  HENT:  Head: Normocephalic and atraumatic.  Right Ear: Tympanic membrane and ear canal normal.  Left Ear: Tympanic membrane and ear canal normal.  Mouth/Throat: Oropharynx is clear and moist.  Eyes: Pupils are equal, round, and reactive to light. No scleral icterus.  Neck: Normal range of motion. No thyromegaly present.  Cardiovascular: Normal rate  and regular rhythm.   No murmur heard. Pulmonary/Chest: Effort normal and breath sounds normal. No respiratory distress. He has no wheezes. He has no rales. He exhibits no tenderness.  Abdominal: Soft. Bowel sounds are normal. He exhibits no distension and no mass. There is no tenderness. There is no rebound and no guarding.  Musculoskeletal: He exhibits no edema. Right foot drop is noted.  Lymphadenopathy:    He has no cervical adenopathy.  Neurological: He is alert and oriented to person, place, and time. He has normal reflexes. He exhibits normal muscle tone. Coordination normal.  Skin: Skin is warm and dry.  Psychiatric: He has a normal mood and affect. His behavior is normal. Judgment and thought content normal.          Assessment & Plan:        Assessment:          Plan:     During the course of the visit the patient was educated and counseled about appropriate screening and preventive  services including:    Colorectal cancer screening  smoking cessation  Diet review for nutrition referral? Yes ____  Not Indicated _x___   Patient Instructions (the written plan) was given to the patient.  Medicare Attestation I have personally reviewed: The patient's medical and social history Their use of alcohol, tobacco or illicit drugs Their current medications and supplements The patient's functional ability including ADLs,fall risks, home safety risks, cognitive, and hearing and visual impairment Diet and physical activities Evidence for depression or mood disorders  The patient's weight, height, BMI, and visual acuity have been recorded in the chart.  I have made referrals, counseling, and provided education to the patient based on review of the above and I have provided the patient with a written personalized care plan for preventive services.     O'SULLIVAN,Randy Rumple S., NP   10/21/2014

## 2014-10-21 NOTE — Progress Notes (Signed)
Pre visit review using our clinic review tool, if applicable. No additional management support is needed unless otherwise documented below in the visit note. 

## 2014-10-21 NOTE — Patient Instructions (Addendum)
Please complete lab work prior to leaving. Please keep upcoming appointment with the hepatology clinic for the Hep C. Use your cane to prevent falls.  Follow up in 6 months.

## 2014-10-22 DIAGNOSIS — B182 Chronic viral hepatitis C: Secondary | ICD-10-CM | POA: Diagnosis not present

## 2014-10-22 LAB — HIV ANTIBODY (ROUTINE TESTING W REFLEX): HIV: NONREACTIVE

## 2014-10-23 ENCOUNTER — Encounter: Payer: Self-pay | Admitting: Family

## 2014-10-23 ENCOUNTER — Telehealth: Payer: Self-pay | Admitting: *Deleted

## 2014-10-23 DIAGNOSIS — B182 Chronic viral hepatitis C: Secondary | ICD-10-CM | POA: Diagnosis not present

## 2014-10-23 NOTE — Telephone Encounter (Signed)
Faxed add on form to the lab.

## 2014-10-23 NOTE — Telephone Encounter (Signed)
Candise BowensJen-- last note from [place] in April requested [facility] [place]. Have they denied him as well and did pt respond to your call in April?

## 2014-10-23 NOTE — Telephone Encounter (Signed)
Did the [facility] deny him? If not, please send request to them.

## 2014-10-23 NOTE — Telephone Encounter (Signed)
No he never returned may call, please advise

## 2014-10-23 NOTE — Telephone Encounter (Signed)
Ok referral faxed to [facility]/awaiting appt

## 2014-10-23 NOTE — Telephone Encounter (Signed)
Is patient calling back and checking on status? I called him in April to see if he had a request on where to go, cause all the offices were denying him.

## 2014-10-23 NOTE — Telephone Encounter (Signed)
-----   Message from Sandford CrazeMelissa O'Sullivan, NP sent at 10/23/2014  7:26 AM EDT ----- Is is possible to add on a medicare psa please?  Dx screening prostate cancer?

## 2014-10-24 DIAGNOSIS — B182 Chronic viral hepatitis C: Secondary | ICD-10-CM | POA: Diagnosis not present

## 2014-10-24 LAB — PSA: PSA: 3.65 ng/mL (ref <=4.00)

## 2014-10-25 DIAGNOSIS — B182 Chronic viral hepatitis C: Secondary | ICD-10-CM | POA: Diagnosis not present

## 2014-10-26 DIAGNOSIS — B182 Chronic viral hepatitis C: Secondary | ICD-10-CM | POA: Diagnosis not present

## 2014-10-27 ENCOUNTER — Encounter: Payer: Self-pay | Admitting: Family

## 2014-10-27 DIAGNOSIS — B182 Chronic viral hepatitis C: Secondary | ICD-10-CM | POA: Diagnosis not present

## 2014-10-28 DIAGNOSIS — B182 Chronic viral hepatitis C: Secondary | ICD-10-CM | POA: Diagnosis not present

## 2014-10-29 DIAGNOSIS — B182 Chronic viral hepatitis C: Secondary | ICD-10-CM | POA: Diagnosis not present

## 2014-10-30 DIAGNOSIS — B182 Chronic viral hepatitis C: Secondary | ICD-10-CM | POA: Diagnosis not present

## 2014-10-31 DIAGNOSIS — B182 Chronic viral hepatitis C: Secondary | ICD-10-CM | POA: Diagnosis not present

## 2014-11-01 DIAGNOSIS — B182 Chronic viral hepatitis C: Secondary | ICD-10-CM | POA: Diagnosis not present

## 2014-11-02 DIAGNOSIS — B182 Chronic viral hepatitis C: Secondary | ICD-10-CM | POA: Diagnosis not present

## 2014-11-03 DIAGNOSIS — B182 Chronic viral hepatitis C: Secondary | ICD-10-CM | POA: Diagnosis not present

## 2014-11-04 DIAGNOSIS — B182 Chronic viral hepatitis C: Secondary | ICD-10-CM | POA: Diagnosis not present

## 2014-11-05 DIAGNOSIS — B182 Chronic viral hepatitis C: Secondary | ICD-10-CM | POA: Diagnosis not present

## 2014-11-06 DIAGNOSIS — B182 Chronic viral hepatitis C: Secondary | ICD-10-CM | POA: Diagnosis not present

## 2014-11-07 DIAGNOSIS — B182 Chronic viral hepatitis C: Secondary | ICD-10-CM | POA: Diagnosis not present

## 2014-11-08 DIAGNOSIS — B182 Chronic viral hepatitis C: Secondary | ICD-10-CM | POA: Diagnosis not present

## 2014-11-09 DIAGNOSIS — B182 Chronic viral hepatitis C: Secondary | ICD-10-CM | POA: Diagnosis not present

## 2014-11-10 DIAGNOSIS — B182 Chronic viral hepatitis C: Secondary | ICD-10-CM | POA: Diagnosis not present

## 2014-11-11 DIAGNOSIS — B182 Chronic viral hepatitis C: Secondary | ICD-10-CM | POA: Diagnosis not present

## 2014-11-12 DIAGNOSIS — B182 Chronic viral hepatitis C: Secondary | ICD-10-CM | POA: Diagnosis not present

## 2014-11-13 DIAGNOSIS — B182 Chronic viral hepatitis C: Secondary | ICD-10-CM | POA: Diagnosis not present

## 2014-11-14 DIAGNOSIS — B182 Chronic viral hepatitis C: Secondary | ICD-10-CM | POA: Diagnosis not present

## 2014-11-15 DIAGNOSIS — B182 Chronic viral hepatitis C: Secondary | ICD-10-CM | POA: Diagnosis not present

## 2014-11-16 DIAGNOSIS — B182 Chronic viral hepatitis C: Secondary | ICD-10-CM | POA: Diagnosis not present

## 2014-11-17 DIAGNOSIS — B182 Chronic viral hepatitis C: Secondary | ICD-10-CM | POA: Diagnosis not present

## 2014-11-18 ENCOUNTER — Other Ambulatory Visit (HOSPITAL_COMMUNITY): Payer: Self-pay | Admitting: Nurse Practitioner

## 2014-11-18 DIAGNOSIS — B182 Chronic viral hepatitis C: Secondary | ICD-10-CM

## 2014-11-19 DIAGNOSIS — B182 Chronic viral hepatitis C: Secondary | ICD-10-CM | POA: Diagnosis not present

## 2014-11-20 DIAGNOSIS — G890 Central pain syndrome: Secondary | ICD-10-CM | POA: Diagnosis not present

## 2014-11-20 DIAGNOSIS — G540 Brachial plexus disorders: Secondary | ICD-10-CM | POA: Diagnosis not present

## 2014-11-20 DIAGNOSIS — G541 Lumbosacral plexus disorders: Secondary | ICD-10-CM | POA: Diagnosis not present

## 2014-11-20 DIAGNOSIS — M25562 Pain in left knee: Secondary | ICD-10-CM | POA: Diagnosis not present

## 2014-11-20 DIAGNOSIS — G603 Idiopathic progressive neuropathy: Secondary | ICD-10-CM | POA: Diagnosis not present

## 2014-11-20 DIAGNOSIS — B182 Chronic viral hepatitis C: Secondary | ICD-10-CM | POA: Diagnosis not present

## 2014-11-21 DIAGNOSIS — B182 Chronic viral hepatitis C: Secondary | ICD-10-CM | POA: Diagnosis not present

## 2014-11-22 DIAGNOSIS — B182 Chronic viral hepatitis C: Secondary | ICD-10-CM | POA: Diagnosis not present

## 2014-11-23 DIAGNOSIS — B182 Chronic viral hepatitis C: Secondary | ICD-10-CM | POA: Diagnosis not present

## 2014-11-24 DIAGNOSIS — B182 Chronic viral hepatitis C: Secondary | ICD-10-CM | POA: Diagnosis not present

## 2014-11-25 DIAGNOSIS — B182 Chronic viral hepatitis C: Secondary | ICD-10-CM | POA: Diagnosis not present

## 2014-11-26 DIAGNOSIS — B182 Chronic viral hepatitis C: Secondary | ICD-10-CM | POA: Diagnosis not present

## 2014-11-27 DIAGNOSIS — B182 Chronic viral hepatitis C: Secondary | ICD-10-CM | POA: Diagnosis not present

## 2014-11-28 DIAGNOSIS — B182 Chronic viral hepatitis C: Secondary | ICD-10-CM | POA: Diagnosis not present

## 2014-11-29 DIAGNOSIS — B182 Chronic viral hepatitis C: Secondary | ICD-10-CM | POA: Diagnosis not present

## 2014-11-30 DIAGNOSIS — B182 Chronic viral hepatitis C: Secondary | ICD-10-CM | POA: Diagnosis not present

## 2014-12-01 DIAGNOSIS — B182 Chronic viral hepatitis C: Secondary | ICD-10-CM | POA: Diagnosis not present

## 2014-12-02 DIAGNOSIS — B182 Chronic viral hepatitis C: Secondary | ICD-10-CM | POA: Diagnosis not present

## 2014-12-03 DIAGNOSIS — B182 Chronic viral hepatitis C: Secondary | ICD-10-CM | POA: Diagnosis not present

## 2014-12-04 DIAGNOSIS — B182 Chronic viral hepatitis C: Secondary | ICD-10-CM | POA: Diagnosis not present

## 2014-12-05 DIAGNOSIS — B182 Chronic viral hepatitis C: Secondary | ICD-10-CM | POA: Diagnosis not present

## 2014-12-06 DIAGNOSIS — B182 Chronic viral hepatitis C: Secondary | ICD-10-CM | POA: Diagnosis not present

## 2014-12-07 DIAGNOSIS — B182 Chronic viral hepatitis C: Secondary | ICD-10-CM | POA: Diagnosis not present

## 2014-12-08 DIAGNOSIS — B182 Chronic viral hepatitis C: Secondary | ICD-10-CM | POA: Diagnosis not present

## 2014-12-09 DIAGNOSIS — B182 Chronic viral hepatitis C: Secondary | ICD-10-CM | POA: Diagnosis not present

## 2014-12-10 DIAGNOSIS — B182 Chronic viral hepatitis C: Secondary | ICD-10-CM | POA: Diagnosis not present

## 2014-12-11 DIAGNOSIS — B182 Chronic viral hepatitis C: Secondary | ICD-10-CM | POA: Diagnosis not present

## 2014-12-12 DIAGNOSIS — B182 Chronic viral hepatitis C: Secondary | ICD-10-CM | POA: Diagnosis not present

## 2014-12-13 DIAGNOSIS — B182 Chronic viral hepatitis C: Secondary | ICD-10-CM | POA: Diagnosis not present

## 2014-12-14 DIAGNOSIS — G541 Lumbosacral plexus disorders: Secondary | ICD-10-CM | POA: Diagnosis not present

## 2014-12-14 DIAGNOSIS — B182 Chronic viral hepatitis C: Secondary | ICD-10-CM | POA: Diagnosis not present

## 2014-12-14 DIAGNOSIS — G890 Central pain syndrome: Secondary | ICD-10-CM | POA: Diagnosis not present

## 2014-12-14 DIAGNOSIS — M25562 Pain in left knee: Secondary | ICD-10-CM | POA: Diagnosis not present

## 2014-12-14 DIAGNOSIS — G540 Brachial plexus disorders: Secondary | ICD-10-CM | POA: Diagnosis not present

## 2014-12-14 DIAGNOSIS — G8929 Other chronic pain: Secondary | ICD-10-CM | POA: Diagnosis not present

## 2014-12-15 ENCOUNTER — Ambulatory Visit (HOSPITAL_COMMUNITY)
Admission: RE | Admit: 2014-12-15 | Discharge: 2014-12-15 | Disposition: A | Discharge status: Home / Self Care | Payer: Commercial Managed Care - HMO | Source: Ambulatory Visit | Attending: Nurse Practitioner | Admitting: Nurse Practitioner

## 2014-12-15 DIAGNOSIS — N281 Cyst of kidney, acquired: Secondary | ICD-10-CM | POA: Diagnosis not present

## 2014-12-15 DIAGNOSIS — I77811 Abdominal aortic ectasia: Secondary | ICD-10-CM | POA: Insufficient documentation

## 2014-12-15 DIAGNOSIS — B182 Chronic viral hepatitis C: Secondary | ICD-10-CM | POA: Insufficient documentation

## 2014-12-16 DIAGNOSIS — B182 Chronic viral hepatitis C: Secondary | ICD-10-CM | POA: Diagnosis not present

## 2014-12-17 DIAGNOSIS — B182 Chronic viral hepatitis C: Secondary | ICD-10-CM | POA: Diagnosis not present

## 2014-12-18 DIAGNOSIS — B182 Chronic viral hepatitis C: Secondary | ICD-10-CM | POA: Diagnosis not present

## 2014-12-19 DIAGNOSIS — B182 Chronic viral hepatitis C: Secondary | ICD-10-CM | POA: Diagnosis not present

## 2014-12-20 DIAGNOSIS — B182 Chronic viral hepatitis C: Secondary | ICD-10-CM | POA: Diagnosis not present

## 2014-12-21 DIAGNOSIS — B182 Chronic viral hepatitis C: Secondary | ICD-10-CM | POA: Diagnosis not present

## 2014-12-22 DIAGNOSIS — B182 Chronic viral hepatitis C: Secondary | ICD-10-CM | POA: Diagnosis not present

## 2014-12-23 DIAGNOSIS — B182 Chronic viral hepatitis C: Secondary | ICD-10-CM | POA: Diagnosis not present

## 2014-12-24 DIAGNOSIS — B182 Chronic viral hepatitis C: Secondary | ICD-10-CM | POA: Diagnosis not present

## 2014-12-25 DIAGNOSIS — B182 Chronic viral hepatitis C: Secondary | ICD-10-CM | POA: Diagnosis not present

## 2014-12-26 DIAGNOSIS — B182 Chronic viral hepatitis C: Secondary | ICD-10-CM | POA: Diagnosis not present

## 2014-12-27 DIAGNOSIS — B182 Chronic viral hepatitis C: Secondary | ICD-10-CM | POA: Diagnosis not present

## 2014-12-28 DIAGNOSIS — B182 Chronic viral hepatitis C: Secondary | ICD-10-CM | POA: Diagnosis not present

## 2014-12-29 ENCOUNTER — Telehealth: Payer: Self-pay | Admitting: Family

## 2014-12-29 DIAGNOSIS — B182 Chronic viral hepatitis C: Secondary | ICD-10-CM | POA: Diagnosis not present

## 2014-12-29 NOTE — Telephone Encounter (Signed)
Reason for call: Pt called stating that [facility], Ph [phone], told him DOS 11/20/14 was not covered because we didn't provide referral/auth. Pt has [employer] and states needs approval. Pain Mgmt told him to call us to obtain. Pt states he was told he would have to pay over $1000 for appt if we don't get the auth for the visit.

## 2014-12-30 DIAGNOSIS — B182 Chronic viral hepatitis C: Secondary | ICD-10-CM | POA: Diagnosis not present

## 2014-12-31 DIAGNOSIS — B182 Chronic viral hepatitis C: Secondary | ICD-10-CM | POA: Diagnosis not present

## 2015-01-01 DIAGNOSIS — B182 Chronic viral hepatitis C: Secondary | ICD-10-CM | POA: Diagnosis not present

## 2015-01-02 DIAGNOSIS — B182 Chronic viral hepatitis C: Secondary | ICD-10-CM | POA: Diagnosis not present

## 2015-01-03 DIAGNOSIS — B182 Chronic viral hepatitis C: Secondary | ICD-10-CM | POA: Diagnosis not present

## 2015-01-04 DIAGNOSIS — B182 Chronic viral hepatitis C: Secondary | ICD-10-CM | POA: Diagnosis not present

## 2015-01-05 DIAGNOSIS — B182 Chronic viral hepatitis C: Secondary | ICD-10-CM | POA: Diagnosis not present

## 2015-01-05 NOTE — Telephone Encounter (Signed)
Candise Bowens-- can you obtain [employer] [employer] and let me know when completed. Thanks!

## 2015-01-05 NOTE — Telephone Encounter (Signed)
Notified pt. He states he is in pain and wants to know what he should do. Advised him to contact pain management and see if they can work out a payment plan for him and to let us know if he schedules a future appt with them so we can obtain authorization for future visits. Pt became very upset but agreed to contact pain management re: bill. He will let us know the status.

## 2015-01-05 NOTE — Telephone Encounter (Signed)
I agree that patient should contact pt management for further pain recs.

## 2015-01-05 NOTE — Telephone Encounter (Signed)
Referrals can not be back dated. Our office was not made aware of that appt. We did not refer him to that office. Please advise

## 2015-01-05 NOTE — Telephone Encounter (Signed)
Pt is calling back stating he has not heard form Melissa yet and has some questions in regards to his pain management.

## 2015-01-06 DIAGNOSIS — B182 Chronic viral hepatitis C: Secondary | ICD-10-CM | POA: Diagnosis not present

## 2015-01-07 DIAGNOSIS — B182 Chronic viral hepatitis C: Secondary | ICD-10-CM | POA: Diagnosis not present

## 2015-01-08 DIAGNOSIS — B182 Chronic viral hepatitis C: Secondary | ICD-10-CM | POA: Diagnosis not present

## 2015-01-09 DIAGNOSIS — B182 Chronic viral hepatitis C: Secondary | ICD-10-CM | POA: Diagnosis not present

## 2015-01-10 DIAGNOSIS — B182 Chronic viral hepatitis C: Secondary | ICD-10-CM | POA: Diagnosis not present

## 2015-01-11 DIAGNOSIS — L84 Corns and callosities: Secondary | ICD-10-CM | POA: Diagnosis not present

## 2015-01-11 DIAGNOSIS — B351 Tinea unguium: Secondary | ICD-10-CM | POA: Diagnosis not present

## 2015-01-11 DIAGNOSIS — B182 Chronic viral hepatitis C: Secondary | ICD-10-CM | POA: Diagnosis not present

## 2015-01-11 DIAGNOSIS — M79672 Pain in left foot: Secondary | ICD-10-CM | POA: Diagnosis not present

## 2015-01-11 DIAGNOSIS — M79671 Pain in right foot: Secondary | ICD-10-CM | POA: Diagnosis not present

## 2015-01-12 DIAGNOSIS — B182 Chronic viral hepatitis C: Secondary | ICD-10-CM | POA: Diagnosis not present

## 2015-01-13 DIAGNOSIS — B182 Chronic viral hepatitis C: Secondary | ICD-10-CM | POA: Diagnosis not present

## 2015-01-14 DIAGNOSIS — B182 Chronic viral hepatitis C: Secondary | ICD-10-CM | POA: Diagnosis not present

## 2015-01-15 DIAGNOSIS — B182 Chronic viral hepatitis C: Secondary | ICD-10-CM | POA: Diagnosis not present

## 2015-01-16 DIAGNOSIS — B182 Chronic viral hepatitis C: Secondary | ICD-10-CM | POA: Diagnosis not present

## 2015-01-17 DIAGNOSIS — B182 Chronic viral hepatitis C: Secondary | ICD-10-CM | POA: Diagnosis not present

## 2015-01-18 DIAGNOSIS — B182 Chronic viral hepatitis C: Secondary | ICD-10-CM | POA: Diagnosis not present

## 2015-01-19 DIAGNOSIS — B182 Chronic viral hepatitis C: Secondary | ICD-10-CM | POA: Diagnosis not present

## 2015-01-20 DIAGNOSIS — B182 Chronic viral hepatitis C: Secondary | ICD-10-CM | POA: Diagnosis not present

## 2015-01-21 DIAGNOSIS — B182 Chronic viral hepatitis C: Secondary | ICD-10-CM | POA: Diagnosis not present

## 2015-01-22 DIAGNOSIS — B182 Chronic viral hepatitis C: Secondary | ICD-10-CM | POA: Diagnosis not present

## 2015-01-23 DIAGNOSIS — B182 Chronic viral hepatitis C: Secondary | ICD-10-CM | POA: Diagnosis not present

## 2015-01-24 DIAGNOSIS — B182 Chronic viral hepatitis C: Secondary | ICD-10-CM | POA: Diagnosis not present

## 2015-01-25 DIAGNOSIS — B182 Chronic viral hepatitis C: Secondary | ICD-10-CM | POA: Diagnosis not present

## 2015-01-26 DIAGNOSIS — B182 Chronic viral hepatitis C: Secondary | ICD-10-CM | POA: Diagnosis not present

## 2015-01-27 DIAGNOSIS — B182 Chronic viral hepatitis C: Secondary | ICD-10-CM | POA: Diagnosis not present

## 2015-01-28 DIAGNOSIS — B182 Chronic viral hepatitis C: Secondary | ICD-10-CM | POA: Diagnosis not present

## 2015-01-29 DIAGNOSIS — B182 Chronic viral hepatitis C: Secondary | ICD-10-CM | POA: Diagnosis not present

## 2015-01-30 DIAGNOSIS — B182 Chronic viral hepatitis C: Secondary | ICD-10-CM | POA: Diagnosis not present

## 2015-01-31 DIAGNOSIS — B182 Chronic viral hepatitis C: Secondary | ICD-10-CM | POA: Diagnosis not present

## 2015-02-01 DIAGNOSIS — B182 Chronic viral hepatitis C: Secondary | ICD-10-CM | POA: Diagnosis not present

## 2015-02-02 DIAGNOSIS — B182 Chronic viral hepatitis C: Secondary | ICD-10-CM | POA: Diagnosis not present

## 2015-02-03 DIAGNOSIS — B182 Chronic viral hepatitis C: Secondary | ICD-10-CM | POA: Diagnosis not present

## 2015-02-04 DIAGNOSIS — B182 Chronic viral hepatitis C: Secondary | ICD-10-CM | POA: Diagnosis not present

## 2015-02-05 DIAGNOSIS — B182 Chronic viral hepatitis C: Secondary | ICD-10-CM | POA: Diagnosis not present

## 2015-02-06 DIAGNOSIS — B182 Chronic viral hepatitis C: Secondary | ICD-10-CM | POA: Diagnosis not present

## 2015-02-07 DIAGNOSIS — B182 Chronic viral hepatitis C: Secondary | ICD-10-CM | POA: Diagnosis not present

## 2015-02-08 DIAGNOSIS — B182 Chronic viral hepatitis C: Secondary | ICD-10-CM | POA: Diagnosis not present

## 2015-02-09 DIAGNOSIS — B182 Chronic viral hepatitis C: Secondary | ICD-10-CM | POA: Diagnosis not present

## 2015-02-10 DIAGNOSIS — B182 Chronic viral hepatitis C: Secondary | ICD-10-CM | POA: Diagnosis not present

## 2015-02-11 DIAGNOSIS — B182 Chronic viral hepatitis C: Secondary | ICD-10-CM | POA: Diagnosis not present

## 2015-02-12 DIAGNOSIS — B182 Chronic viral hepatitis C: Secondary | ICD-10-CM | POA: Diagnosis not present

## 2015-02-13 DIAGNOSIS — B182 Chronic viral hepatitis C: Secondary | ICD-10-CM | POA: Diagnosis not present

## 2015-02-14 DIAGNOSIS — B182 Chronic viral hepatitis C: Secondary | ICD-10-CM | POA: Diagnosis not present

## 2015-02-15 DIAGNOSIS — B182 Chronic viral hepatitis C: Secondary | ICD-10-CM | POA: Diagnosis not present

## 2015-02-16 DIAGNOSIS — B182 Chronic viral hepatitis C: Secondary | ICD-10-CM | POA: Diagnosis not present

## 2015-02-17 DIAGNOSIS — B182 Chronic viral hepatitis C: Secondary | ICD-10-CM | POA: Diagnosis not present

## 2015-02-18 DIAGNOSIS — B182 Chronic viral hepatitis C: Secondary | ICD-10-CM | POA: Diagnosis not present

## 2015-02-19 DIAGNOSIS — B182 Chronic viral hepatitis C: Secondary | ICD-10-CM | POA: Diagnosis not present

## 2015-02-20 DIAGNOSIS — B182 Chronic viral hepatitis C: Secondary | ICD-10-CM | POA: Diagnosis not present

## 2015-02-21 DIAGNOSIS — B182 Chronic viral hepatitis C: Secondary | ICD-10-CM | POA: Diagnosis not present

## 2015-02-22 DIAGNOSIS — B182 Chronic viral hepatitis C: Secondary | ICD-10-CM | POA: Diagnosis not present

## 2015-02-23 DIAGNOSIS — B182 Chronic viral hepatitis C: Secondary | ICD-10-CM | POA: Diagnosis not present

## 2015-02-24 DIAGNOSIS — B182 Chronic viral hepatitis C: Secondary | ICD-10-CM | POA: Diagnosis not present

## 2015-02-25 DIAGNOSIS — B182 Chronic viral hepatitis C: Secondary | ICD-10-CM | POA: Diagnosis not present

## 2015-02-26 DIAGNOSIS — B182 Chronic viral hepatitis C: Secondary | ICD-10-CM | POA: Diagnosis not present

## 2015-03-01 DIAGNOSIS — B182 Chronic viral hepatitis C: Secondary | ICD-10-CM | POA: Diagnosis not present

## 2015-03-02 DIAGNOSIS — B182 Chronic viral hepatitis C: Secondary | ICD-10-CM | POA: Diagnosis not present

## 2015-03-03 DIAGNOSIS — B182 Chronic viral hepatitis C: Secondary | ICD-10-CM | POA: Diagnosis not present

## 2015-03-08 DIAGNOSIS — B182 Chronic viral hepatitis C: Secondary | ICD-10-CM | POA: Diagnosis not present

## 2015-03-09 DIAGNOSIS — B182 Chronic viral hepatitis C: Secondary | ICD-10-CM | POA: Diagnosis not present

## 2015-03-10 DIAGNOSIS — B182 Chronic viral hepatitis C: Secondary | ICD-10-CM | POA: Diagnosis not present

## 2015-03-11 DIAGNOSIS — B182 Chronic viral hepatitis C: Secondary | ICD-10-CM | POA: Diagnosis not present

## 2015-03-12 DIAGNOSIS — B182 Chronic viral hepatitis C: Secondary | ICD-10-CM | POA: Diagnosis not present

## 2015-03-13 DIAGNOSIS — B182 Chronic viral hepatitis C: Secondary | ICD-10-CM | POA: Diagnosis not present

## 2015-03-14 DIAGNOSIS — B182 Chronic viral hepatitis C: Secondary | ICD-10-CM | POA: Diagnosis not present

## 2015-03-15 DIAGNOSIS — B182 Chronic viral hepatitis C: Secondary | ICD-10-CM | POA: Diagnosis not present

## 2015-03-16 DIAGNOSIS — B182 Chronic viral hepatitis C: Secondary | ICD-10-CM | POA: Diagnosis not present

## 2015-03-17 DIAGNOSIS — B182 Chronic viral hepatitis C: Secondary | ICD-10-CM | POA: Diagnosis not present

## 2015-03-18 DIAGNOSIS — B182 Chronic viral hepatitis C: Secondary | ICD-10-CM | POA: Diagnosis not present

## 2015-03-19 DIAGNOSIS — B182 Chronic viral hepatitis C: Secondary | ICD-10-CM | POA: Diagnosis not present

## 2015-03-20 DIAGNOSIS — B182 Chronic viral hepatitis C: Secondary | ICD-10-CM | POA: Diagnosis not present

## 2015-03-21 DIAGNOSIS — B182 Chronic viral hepatitis C: Secondary | ICD-10-CM | POA: Diagnosis not present

## 2015-03-22 DIAGNOSIS — B182 Chronic viral hepatitis C: Secondary | ICD-10-CM | POA: Diagnosis not present

## 2015-03-23 DIAGNOSIS — B182 Chronic viral hepatitis C: Secondary | ICD-10-CM | POA: Diagnosis not present

## 2015-03-24 ENCOUNTER — Telehealth: Payer: Self-pay | Admitting: Family

## 2015-03-24 DIAGNOSIS — B182 Chronic viral hepatitis C: Secondary | ICD-10-CM | POA: Diagnosis not present

## 2015-03-24 NOTE — Telephone Encounter (Signed)
Pt disputing $50.00 no show fee from 09/30/14. He states he didn't have a phone at that time and just got one again. He said that he did not know about the appt.

## 2015-03-25 DIAGNOSIS — B182 Chronic viral hepatitis C: Secondary | ICD-10-CM | POA: Diagnosis not present

## 2015-03-26 DIAGNOSIS — B182 Chronic viral hepatitis C: Secondary | ICD-10-CM | POA: Diagnosis not present

## 2015-03-27 DIAGNOSIS — B182 Chronic viral hepatitis C: Secondary | ICD-10-CM | POA: Diagnosis not present

## 2015-03-28 DIAGNOSIS — B182 Chronic viral hepatitis C: Secondary | ICD-10-CM | POA: Diagnosis not present

## 2015-03-29 DIAGNOSIS — B182 Chronic viral hepatitis C: Secondary | ICD-10-CM | POA: Diagnosis not present

## 2015-03-30 DIAGNOSIS — B182 Chronic viral hepatitis C: Secondary | ICD-10-CM | POA: Diagnosis not present

## 2015-03-30 NOTE — Telephone Encounter (Signed)
Emailed charge correction to have the fee waived for this one visit

## 2015-03-31 DIAGNOSIS — B182 Chronic viral hepatitis C: Secondary | ICD-10-CM | POA: Diagnosis not present

## 2015-04-01 DIAGNOSIS — B182 Chronic viral hepatitis C: Secondary | ICD-10-CM | POA: Diagnosis not present

## 2015-04-02 DIAGNOSIS — B182 Chronic viral hepatitis C: Secondary | ICD-10-CM | POA: Diagnosis not present

## 2015-04-03 DIAGNOSIS — B182 Chronic viral hepatitis C: Secondary | ICD-10-CM | POA: Diagnosis not present

## 2015-04-04 DIAGNOSIS — B182 Chronic viral hepatitis C: Secondary | ICD-10-CM | POA: Diagnosis not present

## 2015-04-05 DIAGNOSIS — B182 Chronic viral hepatitis C: Secondary | ICD-10-CM | POA: Diagnosis not present

## 2015-04-06 DIAGNOSIS — B182 Chronic viral hepatitis C: Secondary | ICD-10-CM | POA: Diagnosis not present

## 2015-04-07 DIAGNOSIS — B182 Chronic viral hepatitis C: Secondary | ICD-10-CM | POA: Diagnosis not present

## 2015-04-08 DIAGNOSIS — B182 Chronic viral hepatitis C: Secondary | ICD-10-CM | POA: Diagnosis not present

## 2015-04-09 DIAGNOSIS — B182 Chronic viral hepatitis C: Secondary | ICD-10-CM | POA: Diagnosis not present

## 2015-04-10 DIAGNOSIS — B182 Chronic viral hepatitis C: Secondary | ICD-10-CM | POA: Diagnosis not present

## 2015-04-11 DIAGNOSIS — B182 Chronic viral hepatitis C: Secondary | ICD-10-CM | POA: Diagnosis not present

## 2015-04-12 DIAGNOSIS — B182 Chronic viral hepatitis C: Secondary | ICD-10-CM | POA: Diagnosis not present

## 2015-04-13 ENCOUNTER — Telehealth: Payer: Self-pay | Admitting: Family

## 2015-04-13 ENCOUNTER — Ambulatory Visit: Payer: Commercial Managed Care - HMO | Admitting: Family

## 2015-04-13 DIAGNOSIS — Z0289 Encounter for other administrative examinations: Secondary | ICD-10-CM

## 2015-04-13 DIAGNOSIS — B182 Chronic viral hepatitis C: Secondary | ICD-10-CM | POA: Diagnosis not present

## 2015-04-14 DIAGNOSIS — B182 Chronic viral hepatitis C: Secondary | ICD-10-CM | POA: Diagnosis not present

## 2015-04-14 NOTE — Telephone Encounter (Signed)
Pt was no show 04/13/15 9:45am for follow up appt, pt has not rescheduled, charge or no charge? (FYI - pt is disputing the last no show fee charge (see tel notes)).

## 2015-04-15 DIAGNOSIS — B182 Chronic viral hepatitis C: Secondary | ICD-10-CM | POA: Diagnosis not present

## 2015-04-15 NOTE — Telephone Encounter (Signed)
Yes please

## 2015-04-16 DIAGNOSIS — B182 Chronic viral hepatitis C: Secondary | ICD-10-CM | POA: Diagnosis not present

## 2015-04-17 DIAGNOSIS — B182 Chronic viral hepatitis C: Secondary | ICD-10-CM | POA: Diagnosis not present

## 2015-04-18 DIAGNOSIS — B182 Chronic viral hepatitis C: Secondary | ICD-10-CM | POA: Diagnosis not present

## 2015-04-19 DIAGNOSIS — B182 Chronic viral hepatitis C: Secondary | ICD-10-CM | POA: Diagnosis not present

## 2015-04-20 DIAGNOSIS — B182 Chronic viral hepatitis C: Secondary | ICD-10-CM | POA: Diagnosis not present

## 2015-04-21 DIAGNOSIS — B182 Chronic viral hepatitis C: Secondary | ICD-10-CM | POA: Diagnosis not present

## 2015-04-23 DIAGNOSIS — B182 Chronic viral hepatitis C: Secondary | ICD-10-CM | POA: Diagnosis not present

## 2015-04-24 DIAGNOSIS — B182 Chronic viral hepatitis C: Secondary | ICD-10-CM | POA: Diagnosis not present

## 2015-04-25 DIAGNOSIS — B182 Chronic viral hepatitis C: Secondary | ICD-10-CM | POA: Diagnosis not present

## 2015-04-26 DIAGNOSIS — B182 Chronic viral hepatitis C: Secondary | ICD-10-CM | POA: Diagnosis not present

## 2015-04-27 DIAGNOSIS — B182 Chronic viral hepatitis C: Secondary | ICD-10-CM | POA: Diagnosis not present

## 2015-04-28 DIAGNOSIS — B182 Chronic viral hepatitis C: Secondary | ICD-10-CM | POA: Diagnosis not present

## 2015-04-29 DIAGNOSIS — B182 Chronic viral hepatitis C: Secondary | ICD-10-CM | POA: Diagnosis not present

## 2015-04-30 DIAGNOSIS — B182 Chronic viral hepatitis C: Secondary | ICD-10-CM | POA: Diagnosis not present

## 2015-05-01 DIAGNOSIS — B182 Chronic viral hepatitis C: Secondary | ICD-10-CM | POA: Diagnosis not present

## 2015-05-02 DIAGNOSIS — B182 Chronic viral hepatitis C: Secondary | ICD-10-CM | POA: Diagnosis not present

## 2015-05-03 DIAGNOSIS — B182 Chronic viral hepatitis C: Secondary | ICD-10-CM | POA: Diagnosis not present

## 2015-05-04 DIAGNOSIS — B182 Chronic viral hepatitis C: Secondary | ICD-10-CM | POA: Diagnosis not present

## 2015-05-05 DIAGNOSIS — B182 Chronic viral hepatitis C: Secondary | ICD-10-CM | POA: Diagnosis not present

## 2015-05-06 DIAGNOSIS — B182 Chronic viral hepatitis C: Secondary | ICD-10-CM | POA: Diagnosis not present

## 2015-05-07 DIAGNOSIS — B182 Chronic viral hepatitis C: Secondary | ICD-10-CM | POA: Diagnosis not present

## 2015-05-08 DIAGNOSIS — B182 Chronic viral hepatitis C: Secondary | ICD-10-CM | POA: Diagnosis not present

## 2015-05-09 DIAGNOSIS — B182 Chronic viral hepatitis C: Secondary | ICD-10-CM | POA: Diagnosis not present

## 2015-05-10 DIAGNOSIS — B182 Chronic viral hepatitis C: Secondary | ICD-10-CM | POA: Diagnosis not present

## 2015-05-11 DIAGNOSIS — B182 Chronic viral hepatitis C: Secondary | ICD-10-CM | POA: Diagnosis not present

## 2015-05-12 DIAGNOSIS — B182 Chronic viral hepatitis C: Secondary | ICD-10-CM | POA: Diagnosis not present

## 2015-05-13 DIAGNOSIS — B182 Chronic viral hepatitis C: Secondary | ICD-10-CM | POA: Diagnosis not present

## 2015-05-14 DIAGNOSIS — B182 Chronic viral hepatitis C: Secondary | ICD-10-CM | POA: Diagnosis not present

## 2015-05-15 DIAGNOSIS — B182 Chronic viral hepatitis C: Secondary | ICD-10-CM | POA: Diagnosis not present

## 2015-05-16 DIAGNOSIS — B182 Chronic viral hepatitis C: Secondary | ICD-10-CM | POA: Diagnosis not present

## 2015-05-17 DIAGNOSIS — B182 Chronic viral hepatitis C: Secondary | ICD-10-CM | POA: Diagnosis not present

## 2015-05-18 DIAGNOSIS — B182 Chronic viral hepatitis C: Secondary | ICD-10-CM | POA: Diagnosis not present

## 2015-05-19 DIAGNOSIS — B182 Chronic viral hepatitis C: Secondary | ICD-10-CM | POA: Diagnosis not present

## 2015-05-20 DIAGNOSIS — B182 Chronic viral hepatitis C: Secondary | ICD-10-CM | POA: Diagnosis not present

## 2015-05-21 DIAGNOSIS — B182 Chronic viral hepatitis C: Secondary | ICD-10-CM | POA: Diagnosis not present

## 2015-05-22 DIAGNOSIS — B182 Chronic viral hepatitis C: Secondary | ICD-10-CM | POA: Diagnosis not present

## 2015-05-24 DIAGNOSIS — B182 Chronic viral hepatitis C: Secondary | ICD-10-CM | POA: Diagnosis not present

## 2015-05-25 DIAGNOSIS — B182 Chronic viral hepatitis C: Secondary | ICD-10-CM | POA: Diagnosis not present

## 2015-05-26 DIAGNOSIS — B182 Chronic viral hepatitis C: Secondary | ICD-10-CM | POA: Diagnosis not present

## 2015-05-27 DIAGNOSIS — B182 Chronic viral hepatitis C: Secondary | ICD-10-CM | POA: Diagnosis not present

## 2015-05-28 DIAGNOSIS — B182 Chronic viral hepatitis C: Secondary | ICD-10-CM | POA: Diagnosis not present

## 2015-05-29 DIAGNOSIS — B182 Chronic viral hepatitis C: Secondary | ICD-10-CM | POA: Diagnosis not present

## 2015-05-31 DIAGNOSIS — B182 Chronic viral hepatitis C: Secondary | ICD-10-CM | POA: Diagnosis not present

## 2015-06-01 DIAGNOSIS — B182 Chronic viral hepatitis C: Secondary | ICD-10-CM | POA: Diagnosis not present

## 2015-06-02 DIAGNOSIS — B182 Chronic viral hepatitis C: Secondary | ICD-10-CM | POA: Diagnosis not present

## 2015-06-03 DIAGNOSIS — B182 Chronic viral hepatitis C: Secondary | ICD-10-CM | POA: Diagnosis not present

## 2015-06-04 DIAGNOSIS — B182 Chronic viral hepatitis C: Secondary | ICD-10-CM | POA: Diagnosis not present

## 2015-06-05 DIAGNOSIS — B182 Chronic viral hepatitis C: Secondary | ICD-10-CM | POA: Diagnosis not present

## 2015-06-06 DIAGNOSIS — B182 Chronic viral hepatitis C: Secondary | ICD-10-CM | POA: Diagnosis not present

## 2015-06-07 DIAGNOSIS — B182 Chronic viral hepatitis C: Secondary | ICD-10-CM | POA: Diagnosis not present

## 2015-06-08 DIAGNOSIS — B182 Chronic viral hepatitis C: Secondary | ICD-10-CM | POA: Diagnosis not present

## 2015-06-09 DIAGNOSIS — B182 Chronic viral hepatitis C: Secondary | ICD-10-CM | POA: Diagnosis not present

## 2015-06-10 DIAGNOSIS — B182 Chronic viral hepatitis C: Secondary | ICD-10-CM | POA: Diagnosis not present

## 2015-06-11 DIAGNOSIS — B182 Chronic viral hepatitis C: Secondary | ICD-10-CM | POA: Diagnosis not present

## 2015-06-12 DIAGNOSIS — B182 Chronic viral hepatitis C: Secondary | ICD-10-CM | POA: Diagnosis not present

## 2015-06-13 DIAGNOSIS — B182 Chronic viral hepatitis C: Secondary | ICD-10-CM | POA: Diagnosis not present

## 2015-06-14 DIAGNOSIS — B182 Chronic viral hepatitis C: Secondary | ICD-10-CM | POA: Diagnosis not present

## 2015-06-15 DIAGNOSIS — B182 Chronic viral hepatitis C: Secondary | ICD-10-CM | POA: Diagnosis not present

## 2015-06-16 DIAGNOSIS — B182 Chronic viral hepatitis C: Secondary | ICD-10-CM | POA: Diagnosis not present

## 2015-06-17 DIAGNOSIS — B182 Chronic viral hepatitis C: Secondary | ICD-10-CM | POA: Diagnosis not present

## 2015-06-18 DIAGNOSIS — B182 Chronic viral hepatitis C: Secondary | ICD-10-CM | POA: Diagnosis not present

## 2015-06-19 DIAGNOSIS — B182 Chronic viral hepatitis C: Secondary | ICD-10-CM | POA: Diagnosis not present

## 2015-06-20 DIAGNOSIS — B182 Chronic viral hepatitis C: Secondary | ICD-10-CM | POA: Diagnosis not present

## 2015-06-21 DIAGNOSIS — B182 Chronic viral hepatitis C: Secondary | ICD-10-CM | POA: Diagnosis not present

## 2015-06-22 DIAGNOSIS — B182 Chronic viral hepatitis C: Secondary | ICD-10-CM | POA: Diagnosis not present

## 2015-06-23 DIAGNOSIS — B182 Chronic viral hepatitis C: Secondary | ICD-10-CM | POA: Diagnosis not present

## 2015-06-24 DIAGNOSIS — B182 Chronic viral hepatitis C: Secondary | ICD-10-CM | POA: Diagnosis not present

## 2015-06-25 DIAGNOSIS — B182 Chronic viral hepatitis C: Secondary | ICD-10-CM | POA: Diagnosis not present

## 2015-06-26 DIAGNOSIS — B182 Chronic viral hepatitis C: Secondary | ICD-10-CM | POA: Diagnosis not present

## 2015-06-27 DIAGNOSIS — B182 Chronic viral hepatitis C: Secondary | ICD-10-CM | POA: Diagnosis not present

## 2015-06-28 DIAGNOSIS — B182 Chronic viral hepatitis C: Secondary | ICD-10-CM | POA: Diagnosis not present

## 2015-06-29 DIAGNOSIS — B182 Chronic viral hepatitis C: Secondary | ICD-10-CM | POA: Diagnosis not present

## 2015-06-30 DIAGNOSIS — B182 Chronic viral hepatitis C: Secondary | ICD-10-CM | POA: Diagnosis not present

## 2015-07-01 DIAGNOSIS — B182 Chronic viral hepatitis C: Secondary | ICD-10-CM | POA: Diagnosis not present

## 2015-07-02 DIAGNOSIS — B182 Chronic viral hepatitis C: Secondary | ICD-10-CM | POA: Diagnosis not present

## 2015-07-03 DIAGNOSIS — B182 Chronic viral hepatitis C: Secondary | ICD-10-CM | POA: Diagnosis not present

## 2015-07-04 DIAGNOSIS — B182 Chronic viral hepatitis C: Secondary | ICD-10-CM | POA: Diagnosis not present

## 2015-07-05 DIAGNOSIS — B182 Chronic viral hepatitis C: Secondary | ICD-10-CM | POA: Diagnosis not present

## 2015-07-06 DIAGNOSIS — B182 Chronic viral hepatitis C: Secondary | ICD-10-CM | POA: Diagnosis not present

## 2015-07-07 DIAGNOSIS — B182 Chronic viral hepatitis C: Secondary | ICD-10-CM | POA: Diagnosis not present

## 2015-07-08 DIAGNOSIS — B182 Chronic viral hepatitis C: Secondary | ICD-10-CM | POA: Diagnosis not present

## 2015-07-09 DIAGNOSIS — B182 Chronic viral hepatitis C: Secondary | ICD-10-CM | POA: Diagnosis not present

## 2015-07-10 DIAGNOSIS — B182 Chronic viral hepatitis C: Secondary | ICD-10-CM | POA: Diagnosis not present

## 2015-07-11 DIAGNOSIS — B182 Chronic viral hepatitis C: Secondary | ICD-10-CM | POA: Diagnosis not present

## 2015-07-12 DIAGNOSIS — B182 Chronic viral hepatitis C: Secondary | ICD-10-CM | POA: Diagnosis not present

## 2015-07-13 DIAGNOSIS — B182 Chronic viral hepatitis C: Secondary | ICD-10-CM | POA: Diagnosis not present

## 2015-07-14 DIAGNOSIS — B182 Chronic viral hepatitis C: Secondary | ICD-10-CM | POA: Diagnosis not present

## 2015-07-15 DIAGNOSIS — B182 Chronic viral hepatitis C: Secondary | ICD-10-CM | POA: Diagnosis not present

## 2015-07-16 DIAGNOSIS — B182 Chronic viral hepatitis C: Secondary | ICD-10-CM | POA: Diagnosis not present

## 2015-07-17 DIAGNOSIS — B182 Chronic viral hepatitis C: Secondary | ICD-10-CM | POA: Diagnosis not present

## 2015-07-18 DIAGNOSIS — B182 Chronic viral hepatitis C: Secondary | ICD-10-CM | POA: Diagnosis not present

## 2015-07-19 DIAGNOSIS — B182 Chronic viral hepatitis C: Secondary | ICD-10-CM | POA: Diagnosis not present

## 2015-07-20 DIAGNOSIS — B182 Chronic viral hepatitis C: Secondary | ICD-10-CM | POA: Diagnosis not present

## 2015-07-21 DIAGNOSIS — B182 Chronic viral hepatitis C: Secondary | ICD-10-CM | POA: Diagnosis not present

## 2015-07-22 DIAGNOSIS — B182 Chronic viral hepatitis C: Secondary | ICD-10-CM | POA: Diagnosis not present

## 2015-07-23 DIAGNOSIS — B182 Chronic viral hepatitis C: Secondary | ICD-10-CM | POA: Diagnosis not present

## 2015-07-24 DIAGNOSIS — B182 Chronic viral hepatitis C: Secondary | ICD-10-CM | POA: Diagnosis not present

## 2015-07-25 DIAGNOSIS — B182 Chronic viral hepatitis C: Secondary | ICD-10-CM | POA: Diagnosis not present

## 2015-07-26 DIAGNOSIS — B182 Chronic viral hepatitis C: Secondary | ICD-10-CM | POA: Diagnosis not present

## 2015-07-27 DIAGNOSIS — B182 Chronic viral hepatitis C: Secondary | ICD-10-CM | POA: Diagnosis not present

## 2015-07-28 DIAGNOSIS — B182 Chronic viral hepatitis C: Secondary | ICD-10-CM | POA: Diagnosis not present

## 2015-07-29 DIAGNOSIS — B182 Chronic viral hepatitis C: Secondary | ICD-10-CM | POA: Diagnosis not present

## 2015-07-30 DIAGNOSIS — B182 Chronic viral hepatitis C: Secondary | ICD-10-CM | POA: Diagnosis not present

## 2015-07-31 DIAGNOSIS — B182 Chronic viral hepatitis C: Secondary | ICD-10-CM | POA: Diagnosis not present

## 2015-08-01 DIAGNOSIS — B182 Chronic viral hepatitis C: Secondary | ICD-10-CM | POA: Diagnosis not present

## 2015-08-02 DIAGNOSIS — B182 Chronic viral hepatitis C: Secondary | ICD-10-CM | POA: Diagnosis not present

## 2015-08-03 DIAGNOSIS — B182 Chronic viral hepatitis C: Secondary | ICD-10-CM | POA: Diagnosis not present

## 2015-08-04 DIAGNOSIS — B182 Chronic viral hepatitis C: Secondary | ICD-10-CM | POA: Diagnosis not present

## 2015-08-05 DIAGNOSIS — B182 Chronic viral hepatitis C: Secondary | ICD-10-CM | POA: Diagnosis not present

## 2015-08-06 DIAGNOSIS — B182 Chronic viral hepatitis C: Secondary | ICD-10-CM | POA: Diagnosis not present

## 2015-08-07 DIAGNOSIS — B182 Chronic viral hepatitis C: Secondary | ICD-10-CM | POA: Diagnosis not present

## 2015-08-08 DIAGNOSIS — B182 Chronic viral hepatitis C: Secondary | ICD-10-CM | POA: Diagnosis not present

## 2015-08-09 DIAGNOSIS — B182 Chronic viral hepatitis C: Secondary | ICD-10-CM | POA: Diagnosis not present

## 2015-08-10 DIAGNOSIS — B182 Chronic viral hepatitis C: Secondary | ICD-10-CM | POA: Diagnosis not present

## 2015-08-11 DIAGNOSIS — B182 Chronic viral hepatitis C: Secondary | ICD-10-CM | POA: Diagnosis not present

## 2015-08-12 DIAGNOSIS — B182 Chronic viral hepatitis C: Secondary | ICD-10-CM | POA: Diagnosis not present

## 2015-08-13 DIAGNOSIS — B182 Chronic viral hepatitis C: Secondary | ICD-10-CM | POA: Diagnosis not present

## 2015-08-14 DIAGNOSIS — B182 Chronic viral hepatitis C: Secondary | ICD-10-CM | POA: Diagnosis not present

## 2015-08-15 DIAGNOSIS — B182 Chronic viral hepatitis C: Secondary | ICD-10-CM | POA: Diagnosis not present

## 2015-08-16 DIAGNOSIS — B182 Chronic viral hepatitis C: Secondary | ICD-10-CM | POA: Diagnosis not present

## 2015-08-17 DIAGNOSIS — B182 Chronic viral hepatitis C: Secondary | ICD-10-CM | POA: Diagnosis not present

## 2015-08-18 DIAGNOSIS — B182 Chronic viral hepatitis C: Secondary | ICD-10-CM | POA: Diagnosis not present

## 2015-08-19 DIAGNOSIS — B182 Chronic viral hepatitis C: Secondary | ICD-10-CM | POA: Diagnosis not present

## 2015-08-20 DIAGNOSIS — B182 Chronic viral hepatitis C: Secondary | ICD-10-CM | POA: Diagnosis not present

## 2015-08-21 DIAGNOSIS — B182 Chronic viral hepatitis C: Secondary | ICD-10-CM | POA: Diagnosis not present

## 2015-08-22 DIAGNOSIS — B182 Chronic viral hepatitis C: Secondary | ICD-10-CM | POA: Diagnosis not present

## 2015-08-23 DIAGNOSIS — B182 Chronic viral hepatitis C: Secondary | ICD-10-CM | POA: Diagnosis not present

## 2015-08-24 DIAGNOSIS — B182 Chronic viral hepatitis C: Secondary | ICD-10-CM | POA: Diagnosis not present

## 2015-08-25 DIAGNOSIS — B182 Chronic viral hepatitis C: Secondary | ICD-10-CM | POA: Diagnosis not present

## 2015-08-26 DIAGNOSIS — B182 Chronic viral hepatitis C: Secondary | ICD-10-CM | POA: Diagnosis not present

## 2015-08-27 DIAGNOSIS — B182 Chronic viral hepatitis C: Secondary | ICD-10-CM | POA: Diagnosis not present

## 2015-08-28 DIAGNOSIS — B182 Chronic viral hepatitis C: Secondary | ICD-10-CM | POA: Diagnosis not present

## 2015-08-29 DIAGNOSIS — B182 Chronic viral hepatitis C: Secondary | ICD-10-CM | POA: Diagnosis not present

## 2015-08-30 DIAGNOSIS — B182 Chronic viral hepatitis C: Secondary | ICD-10-CM | POA: Diagnosis not present

## 2015-08-31 DIAGNOSIS — B182 Chronic viral hepatitis C: Secondary | ICD-10-CM | POA: Diagnosis not present

## 2015-09-01 DIAGNOSIS — B182 Chronic viral hepatitis C: Secondary | ICD-10-CM | POA: Diagnosis not present

## 2015-09-02 DIAGNOSIS — B182 Chronic viral hepatitis C: Secondary | ICD-10-CM | POA: Diagnosis not present

## 2015-09-03 DIAGNOSIS — B182 Chronic viral hepatitis C: Secondary | ICD-10-CM | POA: Diagnosis not present

## 2015-09-04 DIAGNOSIS — B182 Chronic viral hepatitis C: Secondary | ICD-10-CM | POA: Diagnosis not present

## 2015-09-05 DIAGNOSIS — B182 Chronic viral hepatitis C: Secondary | ICD-10-CM | POA: Diagnosis not present

## 2015-09-06 DIAGNOSIS — B182 Chronic viral hepatitis C: Secondary | ICD-10-CM | POA: Diagnosis not present

## 2015-09-07 DIAGNOSIS — B182 Chronic viral hepatitis C: Secondary | ICD-10-CM | POA: Diagnosis not present

## 2015-09-08 DIAGNOSIS — B182 Chronic viral hepatitis C: Secondary | ICD-10-CM | POA: Diagnosis not present

## 2015-09-09 DIAGNOSIS — B182 Chronic viral hepatitis C: Secondary | ICD-10-CM | POA: Diagnosis not present

## 2015-09-10 DIAGNOSIS — B182 Chronic viral hepatitis C: Secondary | ICD-10-CM | POA: Diagnosis not present

## 2015-09-11 DIAGNOSIS — B182 Chronic viral hepatitis C: Secondary | ICD-10-CM | POA: Diagnosis not present

## 2015-09-12 DIAGNOSIS — B182 Chronic viral hepatitis C: Secondary | ICD-10-CM | POA: Diagnosis not present

## 2015-09-13 DIAGNOSIS — B182 Chronic viral hepatitis C: Secondary | ICD-10-CM | POA: Diagnosis not present

## 2015-09-14 DIAGNOSIS — B182 Chronic viral hepatitis C: Secondary | ICD-10-CM | POA: Diagnosis not present

## 2015-09-15 ENCOUNTER — Encounter (HOSPITAL_BASED_OUTPATIENT_CLINIC_OR_DEPARTMENT_OTHER): Payer: Self-pay | Admitting: Emergency Medicine

## 2015-09-15 ENCOUNTER — Emergency Department (HOSPITAL_BASED_OUTPATIENT_CLINIC_OR_DEPARTMENT_OTHER): Payer: Commercial Managed Care - HMO

## 2015-09-15 ENCOUNTER — Emergency Department (HOSPITAL_BASED_OUTPATIENT_CLINIC_OR_DEPARTMENT_OTHER)
Admission: EM | Admit: 2015-09-15 | Discharge: 2015-09-15 | Disposition: A | Discharge status: Home / Self Care | Payer: Commercial Managed Care - HMO | Attending: Dermatology | Admitting: Dermatology

## 2015-09-15 DIAGNOSIS — Y939 Activity, unspecified: Secondary | ICD-10-CM | POA: Insufficient documentation

## 2015-09-15 DIAGNOSIS — X58XXXA Exposure to other specified factors, initial encounter: Secondary | ICD-10-CM | POA: Diagnosis not present

## 2015-09-15 DIAGNOSIS — M7989 Other specified soft tissue disorders: Secondary | ICD-10-CM | POA: Diagnosis not present

## 2015-09-15 DIAGNOSIS — Y929 Unspecified place or not applicable: Secondary | ICD-10-CM | POA: Insufficient documentation

## 2015-09-15 DIAGNOSIS — S6992XA Unspecified injury of left wrist, hand and finger(s), initial encounter: Secondary | ICD-10-CM | POA: Diagnosis not present

## 2015-09-15 DIAGNOSIS — Y999 Unspecified external cause status: Secondary | ICD-10-CM | POA: Diagnosis not present

## 2015-09-15 DIAGNOSIS — Z5321 Procedure and treatment not carried out due to patient leaving prior to being seen by health care provider: Secondary | ICD-10-CM | POA: Insufficient documentation

## 2015-09-15 DIAGNOSIS — B182 Chronic viral hepatitis C: Secondary | ICD-10-CM | POA: Diagnosis not present

## 2015-09-15 DIAGNOSIS — Z87891 Personal history of nicotine dependence: Secondary | ICD-10-CM | POA: Diagnosis not present

## 2015-09-15 DIAGNOSIS — M79645 Pain in left finger(s): Secondary | ICD-10-CM | POA: Diagnosis not present

## 2015-09-15 NOTE — ED Notes (Signed)
Pt called for triage x 2 after arrival-no answer
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
## 2015-09-15 NOTE — ED Notes (Signed)
Did not answer for vital recheck 

## 2015-09-15 NOTE — ED Notes (Signed)
Nurse first-pt states he is going to [place]ist[place] area-laughing/joking with reg clerk-NAD

## 2015-09-15 NOTE — ED Notes (Signed)
Nurse first-pt laughing/joking with family in ED WR-NAD

## 2015-09-16 DIAGNOSIS — B182 Chronic viral hepatitis C: Secondary | ICD-10-CM | POA: Diagnosis not present

## 2015-09-17 DIAGNOSIS — B182 Chronic viral hepatitis C: Secondary | ICD-10-CM | POA: Diagnosis not present

## 2015-09-18 DIAGNOSIS — B182 Chronic viral hepatitis C: Secondary | ICD-10-CM | POA: Diagnosis not present

## 2015-09-19 DIAGNOSIS — B182 Chronic viral hepatitis C: Secondary | ICD-10-CM | POA: Diagnosis not present

## 2015-09-20 DIAGNOSIS — B182 Chronic viral hepatitis C: Secondary | ICD-10-CM | POA: Diagnosis not present

## 2015-09-21 DIAGNOSIS — B182 Chronic viral hepatitis C: Secondary | ICD-10-CM | POA: Diagnosis not present

## 2015-09-22 DIAGNOSIS — B182 Chronic viral hepatitis C: Secondary | ICD-10-CM | POA: Diagnosis not present

## 2015-09-23 DIAGNOSIS — B182 Chronic viral hepatitis C: Secondary | ICD-10-CM | POA: Diagnosis not present

## 2015-09-24 ENCOUNTER — Other Ambulatory Visit: Payer: Self-pay | Admitting: Family

## 2015-09-24 DIAGNOSIS — B182 Chronic viral hepatitis C: Secondary | ICD-10-CM | POA: Diagnosis not present

## 2015-09-24 NOTE — Telephone Encounter (Signed)
Pt has CPE scheduled 10/27/15. Please advise gabapentin request. Pt last seen 09/2014.

## 2015-09-25 DIAGNOSIS — B182 Chronic viral hepatitis C: Secondary | ICD-10-CM | POA: Diagnosis not present

## 2015-09-26 DIAGNOSIS — B182 Chronic viral hepatitis C: Secondary | ICD-10-CM | POA: Diagnosis not present

## 2015-09-27 DIAGNOSIS — B182 Chronic viral hepatitis C: Secondary | ICD-10-CM | POA: Diagnosis not present

## 2015-09-28 DIAGNOSIS — B182 Chronic viral hepatitis C: Secondary | ICD-10-CM | POA: Diagnosis not present

## 2015-09-29 DIAGNOSIS — B182 Chronic viral hepatitis C: Secondary | ICD-10-CM | POA: Diagnosis not present

## 2015-09-30 DIAGNOSIS — B182 Chronic viral hepatitis C: Secondary | ICD-10-CM | POA: Diagnosis not present

## 2015-10-01 DIAGNOSIS — B182 Chronic viral hepatitis C: Secondary | ICD-10-CM | POA: Diagnosis not present

## 2015-10-02 DIAGNOSIS — B182 Chronic viral hepatitis C: Secondary | ICD-10-CM | POA: Diagnosis not present

## 2015-10-03 DIAGNOSIS — B182 Chronic viral hepatitis C: Secondary | ICD-10-CM | POA: Diagnosis not present

## 2015-10-04 DIAGNOSIS — B182 Chronic viral hepatitis C: Secondary | ICD-10-CM | POA: Diagnosis not present

## 2015-10-05 DIAGNOSIS — B182 Chronic viral hepatitis C: Secondary | ICD-10-CM | POA: Diagnosis not present

## 2015-10-06 DIAGNOSIS — B182 Chronic viral hepatitis C: Secondary | ICD-10-CM | POA: Diagnosis not present

## 2015-10-07 DIAGNOSIS — B182 Chronic viral hepatitis C: Secondary | ICD-10-CM | POA: Diagnosis not present

## 2015-10-08 DIAGNOSIS — B182 Chronic viral hepatitis C: Secondary | ICD-10-CM | POA: Diagnosis not present

## 2015-10-09 DIAGNOSIS — B182 Chronic viral hepatitis C: Secondary | ICD-10-CM | POA: Diagnosis not present

## 2015-10-10 DIAGNOSIS — B182 Chronic viral hepatitis C: Secondary | ICD-10-CM | POA: Diagnosis not present

## 2015-10-11 DIAGNOSIS — B182 Chronic viral hepatitis C: Secondary | ICD-10-CM | POA: Diagnosis not present

## 2015-10-12 DIAGNOSIS — B182 Chronic viral hepatitis C: Secondary | ICD-10-CM | POA: Diagnosis not present

## 2015-10-13 DIAGNOSIS — B182 Chronic viral hepatitis C: Secondary | ICD-10-CM | POA: Diagnosis not present

## 2015-10-14 DIAGNOSIS — B182 Chronic viral hepatitis C: Secondary | ICD-10-CM | POA: Diagnosis not present

## 2015-10-15 DIAGNOSIS — B182 Chronic viral hepatitis C: Secondary | ICD-10-CM | POA: Diagnosis not present

## 2015-10-16 DIAGNOSIS — B182 Chronic viral hepatitis C: Secondary | ICD-10-CM | POA: Diagnosis not present

## 2015-10-17 DIAGNOSIS — B182 Chronic viral hepatitis C: Secondary | ICD-10-CM | POA: Diagnosis not present

## 2015-10-18 DIAGNOSIS — B182 Chronic viral hepatitis C: Secondary | ICD-10-CM | POA: Diagnosis not present

## 2015-10-19 DIAGNOSIS — B182 Chronic viral hepatitis C: Secondary | ICD-10-CM | POA: Diagnosis not present

## 2015-10-20 DIAGNOSIS — B182 Chronic viral hepatitis C: Secondary | ICD-10-CM | POA: Diagnosis not present

## 2015-10-21 DIAGNOSIS — B182 Chronic viral hepatitis C: Secondary | ICD-10-CM | POA: Diagnosis not present

## 2015-10-22 DIAGNOSIS — B182 Chronic viral hepatitis C: Secondary | ICD-10-CM | POA: Diagnosis not present

## 2015-10-23 DIAGNOSIS — B182 Chronic viral hepatitis C: Secondary | ICD-10-CM | POA: Diagnosis not present

## 2015-10-24 DIAGNOSIS — B182 Chronic viral hepatitis C: Secondary | ICD-10-CM | POA: Diagnosis not present

## 2015-10-26 DIAGNOSIS — B182 Chronic viral hepatitis C: Secondary | ICD-10-CM | POA: Diagnosis not present

## 2015-10-27 ENCOUNTER — Encounter: Payer: Self-pay | Admitting: Family

## 2015-10-27 ENCOUNTER — Ambulatory Visit (INDEPENDENT_AMBULATORY_CARE_PROVIDER_SITE_OTHER): Payer: Commercial Managed Care - HMO | Admitting: Family

## 2015-10-27 VITALS — BP 137/75 | HR 71 | Temp 98.6°F | Resp 16 | Ht 68.0 in | Wt 140.0 lb

## 2015-10-27 DIAGNOSIS — Z0000 Encounter for general adult medical examination without abnormal findings: Secondary | ICD-10-CM | POA: Insufficient documentation

## 2015-10-27 DIAGNOSIS — Z125 Encounter for screening for malignant neoplasm of prostate: Secondary | ICD-10-CM | POA: Diagnosis not present

## 2015-10-27 DIAGNOSIS — M79601 Pain in right arm: Secondary | ICD-10-CM | POA: Diagnosis not present

## 2015-10-27 DIAGNOSIS — G8929 Other chronic pain: Secondary | ICD-10-CM | POA: Diagnosis not present

## 2015-10-27 DIAGNOSIS — B182 Chronic viral hepatitis C: Secondary | ICD-10-CM

## 2015-10-27 DIAGNOSIS — R739 Hyperglycemia, unspecified: Secondary | ICD-10-CM | POA: Diagnosis not present

## 2015-10-27 DIAGNOSIS — M79604 Pain in right leg: Secondary | ICD-10-CM | POA: Diagnosis not present

## 2015-10-27 LAB — HEPATIC FUNCTION PANEL
ALBUMIN: 3.9 g/dL (ref 3.5–5.2)
ALT: 43 U/L (ref 0–53)
AST: 33 U/L (ref 0–37)
Alkaline Phosphatase: 62 U/L (ref 39–117)
Bilirubin, Direct: 0.1 mg/dL (ref 0.0–0.3)
TOTAL PROTEIN: 6.4 g/dL (ref 6.0–8.3)
Total Bilirubin: 0.4 mg/dL (ref 0.2–1.2)

## 2015-10-27 LAB — PSA: PSA: 1.91 ng/mL (ref 0.10–4.00)

## 2015-10-27 LAB — BASIC METABOLIC PANEL
BUN: 9 mg/dL (ref 6–23)
CALCIUM: 9 mg/dL (ref 8.4–10.5)
CO2: 25 meq/L (ref 19–32)
CREATININE: 0.86 mg/dL (ref 0.40–1.50)
Chloride: 110 mEq/L (ref 96–112)
GFR: 115.33 mL/min (ref >60.00)
GLUCOSE: 120 mg/dL — AB (ref 70–99)
Potassium: 4.4 mEq/L (ref 3.5–5.1)
Sodium: 139 mEq/L (ref 135–145)

## 2015-10-27 MED ORDER — GABAPENTIN 300 MG PO CAPS: ORAL_CAPSULE | ORAL | Status: DC

## 2015-10-27 NOTE — Assessment & Plan Note (Signed)
Immunizations reviewed and up to date. Colo up to date.  Unable to exercise due to right foot pain.

## 2015-10-27 NOTE — Patient Instructions (Addendum)
Please complete lab work piror to leaving.   Schedule a medicare wellness visit with RN at your convenience. Follow up with provider in 6 months.

## 2015-10-27 NOTE — Assessment & Plan Note (Signed)
Declines treatment. Obtain follow up LFT.

## 2015-10-27 NOTE — Progress Notes (Signed)
Pre visit review using our clinic review tool, if applicable. No additional management support is needed unless otherwise documented below in the visit note. 
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
## 2015-10-27 NOTE — Assessment & Plan Note (Signed)
Refill provided for gabapentin.

## 2015-10-28 DIAGNOSIS — B182 Chronic viral hepatitis C: Secondary | ICD-10-CM | POA: Diagnosis not present

## 2015-10-29 DIAGNOSIS — B182 Chronic viral hepatitis C: Secondary | ICD-10-CM | POA: Diagnosis not present

## 2015-10-30 DIAGNOSIS — B182 Chronic viral hepatitis C: Secondary | ICD-10-CM | POA: Diagnosis not present

## 2015-10-31 ENCOUNTER — Encounter: Payer: Self-pay | Admitting: Family

## 2015-10-31 DIAGNOSIS — R739 Hyperglycemia, unspecified: Secondary | ICD-10-CM | POA: Insufficient documentation

## 2015-10-31 DIAGNOSIS — B182 Chronic viral hepatitis C: Secondary | ICD-10-CM | POA: Diagnosis not present

## 2015-11-01 DIAGNOSIS — B182 Chronic viral hepatitis C: Secondary | ICD-10-CM | POA: Diagnosis not present

## 2015-11-02 DIAGNOSIS — B182 Chronic viral hepatitis C: Secondary | ICD-10-CM | POA: Diagnosis not present

## 2015-11-03 DIAGNOSIS — B182 Chronic viral hepatitis C: Secondary | ICD-10-CM | POA: Diagnosis not present

## 2015-11-04 DIAGNOSIS — B182 Chronic viral hepatitis C: Secondary | ICD-10-CM | POA: Diagnosis not present

## 2015-11-05 DIAGNOSIS — B182 Chronic viral hepatitis C: Secondary | ICD-10-CM | POA: Diagnosis not present

## 2015-11-06 DIAGNOSIS — B182 Chronic viral hepatitis C: Secondary | ICD-10-CM | POA: Diagnosis not present

## 2015-11-07 DIAGNOSIS — B182 Chronic viral hepatitis C: Secondary | ICD-10-CM | POA: Diagnosis not present

## 2015-11-08 DIAGNOSIS — B182 Chronic viral hepatitis C: Secondary | ICD-10-CM | POA: Diagnosis not present

## 2015-11-09 DIAGNOSIS — B182 Chronic viral hepatitis C: Secondary | ICD-10-CM | POA: Diagnosis not present

## 2015-11-10 DIAGNOSIS — B182 Chronic viral hepatitis C: Secondary | ICD-10-CM | POA: Diagnosis not present

## 2015-11-11 DIAGNOSIS — B182 Chronic viral hepatitis C: Secondary | ICD-10-CM | POA: Diagnosis not present

## 2015-11-12 DIAGNOSIS — B182 Chronic viral hepatitis C: Secondary | ICD-10-CM | POA: Diagnosis not present

## 2015-11-13 DIAGNOSIS — B182 Chronic viral hepatitis C: Secondary | ICD-10-CM | POA: Diagnosis not present

## 2015-11-14 DIAGNOSIS — B182 Chronic viral hepatitis C: Secondary | ICD-10-CM | POA: Diagnosis not present

## 2015-11-15 ENCOUNTER — Telehealth: Payer: Self-pay | Admitting: Family

## 2015-11-15 DIAGNOSIS — M25551 Pain in right hip: Secondary | ICD-10-CM

## 2015-11-15 DIAGNOSIS — B182 Chronic viral hepatitis C: Secondary | ICD-10-CM | POA: Diagnosis not present

## 2015-11-15 NOTE — Telephone Encounter (Signed)
Pt called in because he says that he has an appt. To [facility] on [address]. Pt says that he need a referral to that location. Pt's appt is on 6/27 at 3:30.

## 2015-11-15 NOTE — Telephone Encounter (Signed)
Melissa-- can you place referral then we can ask Candise BowensJen to do [facility] authorization?

## 2015-11-15 NOTE — Telephone Encounter (Signed)
Done

## 2015-11-16 DIAGNOSIS — B182 Chronic viral hepatitis C: Secondary | ICD-10-CM | POA: Diagnosis not present

## 2015-11-17 DIAGNOSIS — B182 Chronic viral hepatitis C: Secondary | ICD-10-CM | POA: Diagnosis not present

## 2015-11-18 DIAGNOSIS — B182 Chronic viral hepatitis C: Secondary | ICD-10-CM | POA: Diagnosis not present

## 2015-11-19 DIAGNOSIS — B182 Chronic viral hepatitis C: Secondary | ICD-10-CM | POA: Diagnosis not present

## 2015-11-20 DIAGNOSIS — B182 Chronic viral hepatitis C: Secondary | ICD-10-CM | POA: Diagnosis not present

## 2015-11-21 DIAGNOSIS — B182 Chronic viral hepatitis C: Secondary | ICD-10-CM | POA: Diagnosis not present

## 2015-11-22 DIAGNOSIS — B182 Chronic viral hepatitis C: Secondary | ICD-10-CM | POA: Diagnosis not present

## 2015-11-22 DIAGNOSIS — M79671 Pain in right foot: Secondary | ICD-10-CM | POA: Diagnosis not present

## 2015-11-22 DIAGNOSIS — M79672 Pain in left foot: Secondary | ICD-10-CM | POA: Diagnosis not present

## 2015-11-22 DIAGNOSIS — L84 Corns and callosities: Secondary | ICD-10-CM | POA: Diagnosis not present

## 2015-11-22 DIAGNOSIS — B351 Tinea unguium: Secondary | ICD-10-CM | POA: Diagnosis not present

## 2015-11-23 DIAGNOSIS — M25551 Pain in right hip: Secondary | ICD-10-CM | POA: Diagnosis not present

## 2015-11-23 DIAGNOSIS — B182 Chronic viral hepatitis C: Secondary | ICD-10-CM | POA: Diagnosis not present

## 2015-11-24 DIAGNOSIS — B182 Chronic viral hepatitis C: Secondary | ICD-10-CM | POA: Diagnosis not present

## 2015-11-25 DIAGNOSIS — B182 Chronic viral hepatitis C: Secondary | ICD-10-CM | POA: Diagnosis not present

## 2015-11-26 ENCOUNTER — Other Ambulatory Visit: Payer: Self-pay | Admitting: Family

## 2015-11-26 DIAGNOSIS — B182 Chronic viral hepatitis C: Secondary | ICD-10-CM | POA: Diagnosis not present

## 2015-11-26 NOTE — Telephone Encounter (Signed)
Refill sent per [facility] refill protocol/SLS  

## 2015-11-27 DIAGNOSIS — B182 Chronic viral hepatitis C: Secondary | ICD-10-CM | POA: Diagnosis not present

## 2015-11-28 DIAGNOSIS — B182 Chronic viral hepatitis C: Secondary | ICD-10-CM | POA: Diagnosis not present

## 2015-11-29 DIAGNOSIS — B182 Chronic viral hepatitis C: Secondary | ICD-10-CM | POA: Diagnosis not present

## 2015-12-01 DIAGNOSIS — B182 Chronic viral hepatitis C: Secondary | ICD-10-CM | POA: Diagnosis not present

## 2015-12-02 DIAGNOSIS — B182 Chronic viral hepatitis C: Secondary | ICD-10-CM | POA: Diagnosis not present

## 2015-12-03 DIAGNOSIS — B182 Chronic viral hepatitis C: Secondary | ICD-10-CM | POA: Diagnosis not present

## 2015-12-04 DIAGNOSIS — B182 Chronic viral hepatitis C: Secondary | ICD-10-CM | POA: Diagnosis not present

## 2015-12-05 DIAGNOSIS — B182 Chronic viral hepatitis C: Secondary | ICD-10-CM | POA: Diagnosis not present

## 2015-12-06 DIAGNOSIS — B182 Chronic viral hepatitis C: Secondary | ICD-10-CM | POA: Diagnosis not present

## 2015-12-07 DIAGNOSIS — B182 Chronic viral hepatitis C: Secondary | ICD-10-CM | POA: Diagnosis not present

## 2015-12-08 DIAGNOSIS — B182 Chronic viral hepatitis C: Secondary | ICD-10-CM | POA: Diagnosis not present

## 2015-12-09 DIAGNOSIS — B182 Chronic viral hepatitis C: Secondary | ICD-10-CM | POA: Diagnosis not present

## 2015-12-10 DIAGNOSIS — B182 Chronic viral hepatitis C: Secondary | ICD-10-CM | POA: Diagnosis not present

## 2015-12-11 DIAGNOSIS — B182 Chronic viral hepatitis C: Secondary | ICD-10-CM | POA: Diagnosis not present

## 2015-12-12 DIAGNOSIS — B182 Chronic viral hepatitis C: Secondary | ICD-10-CM | POA: Diagnosis not present

## 2015-12-13 DIAGNOSIS — B182 Chronic viral hepatitis C: Secondary | ICD-10-CM | POA: Diagnosis not present

## 2015-12-14 DIAGNOSIS — B182 Chronic viral hepatitis C: Secondary | ICD-10-CM | POA: Diagnosis not present

## 2015-12-15 DIAGNOSIS — B182 Chronic viral hepatitis C: Secondary | ICD-10-CM | POA: Diagnosis not present

## 2015-12-16 DIAGNOSIS — B182 Chronic viral hepatitis C: Secondary | ICD-10-CM | POA: Diagnosis not present

## 2015-12-17 DIAGNOSIS — B182 Chronic viral hepatitis C: Secondary | ICD-10-CM | POA: Diagnosis not present

## 2015-12-18 DIAGNOSIS — B182 Chronic viral hepatitis C: Secondary | ICD-10-CM | POA: Diagnosis not present

## 2015-12-19 DIAGNOSIS — B182 Chronic viral hepatitis C: Secondary | ICD-10-CM | POA: Diagnosis not present

## 2015-12-20 DIAGNOSIS — B182 Chronic viral hepatitis C: Secondary | ICD-10-CM | POA: Diagnosis not present

## 2015-12-21 DIAGNOSIS — B182 Chronic viral hepatitis C: Secondary | ICD-10-CM | POA: Diagnosis not present

## 2015-12-22 DIAGNOSIS — B182 Chronic viral hepatitis C: Secondary | ICD-10-CM | POA: Diagnosis not present

## 2015-12-23 DIAGNOSIS — B182 Chronic viral hepatitis C: Secondary | ICD-10-CM | POA: Diagnosis not present

## 2015-12-24 DIAGNOSIS — B182 Chronic viral hepatitis C: Secondary | ICD-10-CM | POA: Diagnosis not present

## 2015-12-25 DIAGNOSIS — B182 Chronic viral hepatitis C: Secondary | ICD-10-CM | POA: Diagnosis not present

## 2015-12-26 DIAGNOSIS — B182 Chronic viral hepatitis C: Secondary | ICD-10-CM | POA: Diagnosis not present

## 2015-12-27 DIAGNOSIS — B182 Chronic viral hepatitis C: Secondary | ICD-10-CM | POA: Diagnosis not present

## 2015-12-28 ENCOUNTER — Other Ambulatory Visit: Payer: Self-pay | Admitting: Family

## 2015-12-28 DIAGNOSIS — B182 Chronic viral hepatitis C: Secondary | ICD-10-CM | POA: Diagnosis not present

## 2015-12-29 DIAGNOSIS — B182 Chronic viral hepatitis C: Secondary | ICD-10-CM | POA: Diagnosis not present

## 2015-12-30 DIAGNOSIS — B182 Chronic viral hepatitis C: Secondary | ICD-10-CM | POA: Diagnosis not present

## 2015-12-31 DIAGNOSIS — B182 Chronic viral hepatitis C: Secondary | ICD-10-CM | POA: Diagnosis not present

## 2016-01-01 DIAGNOSIS — B182 Chronic viral hepatitis C: Secondary | ICD-10-CM | POA: Diagnosis not present

## 2016-01-02 DIAGNOSIS — B182 Chronic viral hepatitis C: Secondary | ICD-10-CM | POA: Diagnosis not present

## 2016-01-03 DIAGNOSIS — B182 Chronic viral hepatitis C: Secondary | ICD-10-CM | POA: Diagnosis not present

## 2016-01-04 DIAGNOSIS — B182 Chronic viral hepatitis C: Secondary | ICD-10-CM | POA: Diagnosis not present

## 2016-01-05 DIAGNOSIS — B182 Chronic viral hepatitis C: Secondary | ICD-10-CM | POA: Diagnosis not present

## 2016-01-06 DIAGNOSIS — B182 Chronic viral hepatitis C: Secondary | ICD-10-CM | POA: Diagnosis not present

## 2016-01-07 DIAGNOSIS — B182 Chronic viral hepatitis C: Secondary | ICD-10-CM | POA: Diagnosis not present

## 2016-01-08 DIAGNOSIS — B182 Chronic viral hepatitis C: Secondary | ICD-10-CM | POA: Diagnosis not present

## 2016-01-09 DIAGNOSIS — B182 Chronic viral hepatitis C: Secondary | ICD-10-CM | POA: Diagnosis not present

## 2016-01-10 DIAGNOSIS — B182 Chronic viral hepatitis C: Secondary | ICD-10-CM | POA: Diagnosis not present

## 2016-01-11 DIAGNOSIS — B182 Chronic viral hepatitis C: Secondary | ICD-10-CM | POA: Diagnosis not present

## 2016-01-12 DIAGNOSIS — B182 Chronic viral hepatitis C: Secondary | ICD-10-CM | POA: Diagnosis not present

## 2016-01-13 DIAGNOSIS — B182 Chronic viral hepatitis C: Secondary | ICD-10-CM | POA: Diagnosis not present

## 2016-01-14 DIAGNOSIS — B182 Chronic viral hepatitis C: Secondary | ICD-10-CM | POA: Diagnosis not present

## 2016-01-15 DIAGNOSIS — B182 Chronic viral hepatitis C: Secondary | ICD-10-CM | POA: Diagnosis not present

## 2016-01-16 DIAGNOSIS — B182 Chronic viral hepatitis C: Secondary | ICD-10-CM | POA: Diagnosis not present

## 2016-01-17 DIAGNOSIS — B182 Chronic viral hepatitis C: Secondary | ICD-10-CM | POA: Diagnosis not present

## 2016-01-18 DIAGNOSIS — B182 Chronic viral hepatitis C: Secondary | ICD-10-CM | POA: Diagnosis not present

## 2016-01-19 DIAGNOSIS — B182 Chronic viral hepatitis C: Secondary | ICD-10-CM | POA: Diagnosis not present

## 2016-01-20 DIAGNOSIS — B182 Chronic viral hepatitis C: Secondary | ICD-10-CM | POA: Diagnosis not present

## 2016-01-21 DIAGNOSIS — B182 Chronic viral hepatitis C: Secondary | ICD-10-CM | POA: Diagnosis not present

## 2016-01-22 DIAGNOSIS — B182 Chronic viral hepatitis C: Secondary | ICD-10-CM | POA: Diagnosis not present

## 2016-01-23 DIAGNOSIS — B182 Chronic viral hepatitis C: Secondary | ICD-10-CM | POA: Diagnosis not present

## 2016-01-24 DIAGNOSIS — B182 Chronic viral hepatitis C: Secondary | ICD-10-CM | POA: Diagnosis not present

## 2016-01-25 DIAGNOSIS — B182 Chronic viral hepatitis C: Secondary | ICD-10-CM | POA: Diagnosis not present

## 2016-01-26 DIAGNOSIS — B182 Chronic viral hepatitis C: Secondary | ICD-10-CM | POA: Diagnosis not present

## 2016-01-27 DIAGNOSIS — B182 Chronic viral hepatitis C: Secondary | ICD-10-CM | POA: Diagnosis not present

## 2016-01-28 DIAGNOSIS — B182 Chronic viral hepatitis C: Secondary | ICD-10-CM | POA: Diagnosis not present

## 2016-01-29 DIAGNOSIS — B182 Chronic viral hepatitis C: Secondary | ICD-10-CM | POA: Diagnosis not present

## 2016-01-30 DIAGNOSIS — B182 Chronic viral hepatitis C: Secondary | ICD-10-CM | POA: Diagnosis not present

## 2016-02-01 DIAGNOSIS — B182 Chronic viral hepatitis C: Secondary | ICD-10-CM | POA: Diagnosis not present

## 2016-02-02 DIAGNOSIS — B182 Chronic viral hepatitis C: Secondary | ICD-10-CM | POA: Diagnosis not present

## 2016-02-03 DIAGNOSIS — B182 Chronic viral hepatitis C: Secondary | ICD-10-CM | POA: Diagnosis not present

## 2016-02-04 DIAGNOSIS — B182 Chronic viral hepatitis C: Secondary | ICD-10-CM | POA: Diagnosis not present

## 2016-02-05 DIAGNOSIS — B182 Chronic viral hepatitis C: Secondary | ICD-10-CM | POA: Diagnosis not present

## 2016-02-06 DIAGNOSIS — B182 Chronic viral hepatitis C: Secondary | ICD-10-CM | POA: Diagnosis not present

## 2016-02-07 DIAGNOSIS — B182 Chronic viral hepatitis C: Secondary | ICD-10-CM | POA: Diagnosis not present

## 2016-02-08 DIAGNOSIS — B182 Chronic viral hepatitis C: Secondary | ICD-10-CM | POA: Diagnosis not present

## 2016-02-09 DIAGNOSIS — B182 Chronic viral hepatitis C: Secondary | ICD-10-CM | POA: Diagnosis not present

## 2016-02-10 DIAGNOSIS — B182 Chronic viral hepatitis C: Secondary | ICD-10-CM | POA: Diagnosis not present

## 2016-02-11 DIAGNOSIS — B182 Chronic viral hepatitis C: Secondary | ICD-10-CM | POA: Diagnosis not present

## 2016-02-12 DIAGNOSIS — B182 Chronic viral hepatitis C: Secondary | ICD-10-CM | POA: Diagnosis not present

## 2016-02-13 DIAGNOSIS — B182 Chronic viral hepatitis C: Secondary | ICD-10-CM | POA: Diagnosis not present

## 2016-02-14 DIAGNOSIS — B182 Chronic viral hepatitis C: Secondary | ICD-10-CM | POA: Diagnosis not present

## 2016-02-15 DIAGNOSIS — B182 Chronic viral hepatitis C: Secondary | ICD-10-CM | POA: Diagnosis not present

## 2016-02-16 DIAGNOSIS — B182 Chronic viral hepatitis C: Secondary | ICD-10-CM | POA: Diagnosis not present

## 2016-02-17 DIAGNOSIS — B182 Chronic viral hepatitis C: Secondary | ICD-10-CM | POA: Diagnosis not present

## 2016-02-18 DIAGNOSIS — B182 Chronic viral hepatitis C: Secondary | ICD-10-CM | POA: Diagnosis not present

## 2016-02-19 DIAGNOSIS — B182 Chronic viral hepatitis C: Secondary | ICD-10-CM | POA: Diagnosis not present

## 2016-02-20 DIAGNOSIS — B182 Chronic viral hepatitis C: Secondary | ICD-10-CM | POA: Diagnosis not present

## 2016-02-21 DIAGNOSIS — B182 Chronic viral hepatitis C: Secondary | ICD-10-CM | POA: Diagnosis not present

## 2016-02-22 DIAGNOSIS — B182 Chronic viral hepatitis C: Secondary | ICD-10-CM | POA: Diagnosis not present

## 2016-02-23 DIAGNOSIS — B182 Chronic viral hepatitis C: Secondary | ICD-10-CM | POA: Diagnosis not present

## 2016-02-24 DIAGNOSIS — B182 Chronic viral hepatitis C: Secondary | ICD-10-CM | POA: Diagnosis not present

## 2016-02-25 DIAGNOSIS — B182 Chronic viral hepatitis C: Secondary | ICD-10-CM | POA: Diagnosis not present

## 2016-02-26 DIAGNOSIS — B182 Chronic viral hepatitis C: Secondary | ICD-10-CM | POA: Diagnosis not present

## 2016-02-27 DIAGNOSIS — B182 Chronic viral hepatitis C: Secondary | ICD-10-CM | POA: Diagnosis not present

## 2016-03-02 DIAGNOSIS — B182 Chronic viral hepatitis C: Secondary | ICD-10-CM | POA: Diagnosis not present

## 2016-03-03 DIAGNOSIS — B182 Chronic viral hepatitis C: Secondary | ICD-10-CM | POA: Diagnosis not present

## 2016-03-04 DIAGNOSIS — B182 Chronic viral hepatitis C: Secondary | ICD-10-CM | POA: Diagnosis not present

## 2016-03-05 DIAGNOSIS — B182 Chronic viral hepatitis C: Secondary | ICD-10-CM | POA: Diagnosis not present

## 2016-03-06 DIAGNOSIS — B182 Chronic viral hepatitis C: Secondary | ICD-10-CM | POA: Diagnosis not present

## 2016-03-07 DIAGNOSIS — B182 Chronic viral hepatitis C: Secondary | ICD-10-CM | POA: Diagnosis not present

## 2016-03-08 DIAGNOSIS — B182 Chronic viral hepatitis C: Secondary | ICD-10-CM | POA: Diagnosis not present

## 2016-03-09 DIAGNOSIS — B182 Chronic viral hepatitis C: Secondary | ICD-10-CM | POA: Diagnosis not present

## 2016-03-10 DIAGNOSIS — B182 Chronic viral hepatitis C: Secondary | ICD-10-CM | POA: Diagnosis not present

## 2016-03-11 DIAGNOSIS — B182 Chronic viral hepatitis C: Secondary | ICD-10-CM | POA: Diagnosis not present

## 2016-03-12 DIAGNOSIS — B182 Chronic viral hepatitis C: Secondary | ICD-10-CM | POA: Diagnosis not present

## 2016-03-13 DIAGNOSIS — B182 Chronic viral hepatitis C: Secondary | ICD-10-CM | POA: Diagnosis not present

## 2016-03-14 DIAGNOSIS — B182 Chronic viral hepatitis C: Secondary | ICD-10-CM | POA: Diagnosis not present

## 2016-03-15 DIAGNOSIS — B182 Chronic viral hepatitis C: Secondary | ICD-10-CM | POA: Diagnosis not present

## 2016-03-16 DIAGNOSIS — B182 Chronic viral hepatitis C: Secondary | ICD-10-CM | POA: Diagnosis not present

## 2016-03-17 DIAGNOSIS — B182 Chronic viral hepatitis C: Secondary | ICD-10-CM | POA: Diagnosis not present

## 2016-03-18 DIAGNOSIS — B182 Chronic viral hepatitis C: Secondary | ICD-10-CM | POA: Diagnosis not present

## 2016-03-19 DIAGNOSIS — B182 Chronic viral hepatitis C: Secondary | ICD-10-CM | POA: Diagnosis not present

## 2016-03-20 DIAGNOSIS — B182 Chronic viral hepatitis C: Secondary | ICD-10-CM | POA: Diagnosis not present

## 2016-03-21 DIAGNOSIS — B182 Chronic viral hepatitis C: Secondary | ICD-10-CM | POA: Diagnosis not present

## 2016-03-22 DIAGNOSIS — B182 Chronic viral hepatitis C: Secondary | ICD-10-CM | POA: Diagnosis not present

## 2016-03-23 DIAGNOSIS — B182 Chronic viral hepatitis C: Secondary | ICD-10-CM | POA: Diagnosis not present

## 2016-03-24 DIAGNOSIS — B182 Chronic viral hepatitis C: Secondary | ICD-10-CM | POA: Diagnosis not present

## 2016-03-25 DIAGNOSIS — B182 Chronic viral hepatitis C: Secondary | ICD-10-CM | POA: Diagnosis not present

## 2016-03-26 DIAGNOSIS — B182 Chronic viral hepatitis C: Secondary | ICD-10-CM | POA: Diagnosis not present

## 2016-03-27 DIAGNOSIS — B182 Chronic viral hepatitis C: Secondary | ICD-10-CM | POA: Diagnosis not present

## 2016-03-28 DIAGNOSIS — B182 Chronic viral hepatitis C: Secondary | ICD-10-CM | POA: Diagnosis not present

## 2016-03-29 DIAGNOSIS — B182 Chronic viral hepatitis C: Secondary | ICD-10-CM | POA: Diagnosis not present

## 2016-03-30 DIAGNOSIS — B182 Chronic viral hepatitis C: Secondary | ICD-10-CM | POA: Diagnosis not present

## 2016-03-31 DIAGNOSIS — B182 Chronic viral hepatitis C: Secondary | ICD-10-CM | POA: Diagnosis not present

## 2016-04-01 DIAGNOSIS — B182 Chronic viral hepatitis C: Secondary | ICD-10-CM | POA: Diagnosis not present

## 2016-04-02 DIAGNOSIS — B182 Chronic viral hepatitis C: Secondary | ICD-10-CM | POA: Diagnosis not present

## 2016-04-03 DIAGNOSIS — B182 Chronic viral hepatitis C: Secondary | ICD-10-CM | POA: Diagnosis not present

## 2016-04-04 DIAGNOSIS — B182 Chronic viral hepatitis C: Secondary | ICD-10-CM | POA: Diagnosis not present

## 2016-04-05 DIAGNOSIS — B182 Chronic viral hepatitis C: Secondary | ICD-10-CM | POA: Diagnosis not present

## 2016-04-06 DIAGNOSIS — B182 Chronic viral hepatitis C: Secondary | ICD-10-CM | POA: Diagnosis not present

## 2016-04-07 DIAGNOSIS — B182 Chronic viral hepatitis C: Secondary | ICD-10-CM | POA: Diagnosis not present

## 2016-04-08 DIAGNOSIS — B182 Chronic viral hepatitis C: Secondary | ICD-10-CM | POA: Diagnosis not present

## 2016-04-09 DIAGNOSIS — B182 Chronic viral hepatitis C: Secondary | ICD-10-CM | POA: Diagnosis not present

## 2016-04-10 DIAGNOSIS — B182 Chronic viral hepatitis C: Secondary | ICD-10-CM | POA: Diagnosis not present

## 2016-04-11 DIAGNOSIS — B182 Chronic viral hepatitis C: Secondary | ICD-10-CM | POA: Diagnosis not present

## 2016-04-12 DIAGNOSIS — B182 Chronic viral hepatitis C: Secondary | ICD-10-CM | POA: Diagnosis not present

## 2016-04-13 DIAGNOSIS — B182 Chronic viral hepatitis C: Secondary | ICD-10-CM | POA: Diagnosis not present

## 2016-04-14 DIAGNOSIS — B182 Chronic viral hepatitis C: Secondary | ICD-10-CM | POA: Diagnosis not present

## 2016-04-15 DIAGNOSIS — B182 Chronic viral hepatitis C: Secondary | ICD-10-CM | POA: Diagnosis not present

## 2016-04-16 DIAGNOSIS — B182 Chronic viral hepatitis C: Secondary | ICD-10-CM | POA: Diagnosis not present

## 2016-04-17 DIAGNOSIS — B182 Chronic viral hepatitis C: Secondary | ICD-10-CM | POA: Diagnosis not present

## 2016-04-18 DIAGNOSIS — B182 Chronic viral hepatitis C: Secondary | ICD-10-CM | POA: Diagnosis not present

## 2016-04-19 DIAGNOSIS — B182 Chronic viral hepatitis C: Secondary | ICD-10-CM | POA: Diagnosis not present

## 2016-04-21 DIAGNOSIS — B182 Chronic viral hepatitis C: Secondary | ICD-10-CM | POA: Diagnosis not present

## 2016-04-22 DIAGNOSIS — B182 Chronic viral hepatitis C: Secondary | ICD-10-CM | POA: Diagnosis not present

## 2016-04-23 DIAGNOSIS — B182 Chronic viral hepatitis C: Secondary | ICD-10-CM | POA: Diagnosis not present

## 2016-04-24 DIAGNOSIS — B182 Chronic viral hepatitis C: Secondary | ICD-10-CM | POA: Diagnosis not present

## 2016-04-25 DIAGNOSIS — B182 Chronic viral hepatitis C: Secondary | ICD-10-CM | POA: Diagnosis not present

## 2016-04-26 DIAGNOSIS — B182 Chronic viral hepatitis C: Secondary | ICD-10-CM | POA: Diagnosis not present

## 2016-04-27 DIAGNOSIS — B182 Chronic viral hepatitis C: Secondary | ICD-10-CM | POA: Diagnosis not present

## 2016-04-28 ENCOUNTER — Ambulatory Visit: Payer: Commercial Managed Care - HMO | Admitting: Family

## 2016-04-28 DIAGNOSIS — Z0289 Encounter for other administrative examinations: Secondary | ICD-10-CM

## 2016-04-28 DIAGNOSIS — B182 Chronic viral hepatitis C: Secondary | ICD-10-CM | POA: Diagnosis not present

## 2016-04-29 DIAGNOSIS — B182 Chronic viral hepatitis C: Secondary | ICD-10-CM | POA: Diagnosis not present

## 2016-04-30 DIAGNOSIS — B182 Chronic viral hepatitis C: Secondary | ICD-10-CM | POA: Diagnosis not present

## 2016-05-01 ENCOUNTER — Encounter: Payer: Self-pay | Admitting: Family

## 2016-05-01 DIAGNOSIS — B182 Chronic viral hepatitis C: Secondary | ICD-10-CM | POA: Diagnosis not present

## 2016-05-02 DIAGNOSIS — B182 Chronic viral hepatitis C: Secondary | ICD-10-CM | POA: Diagnosis not present

## 2016-05-03 DIAGNOSIS — B182 Chronic viral hepatitis C: Secondary | ICD-10-CM | POA: Diagnosis not present

## 2016-05-04 DIAGNOSIS — B182 Chronic viral hepatitis C: Secondary | ICD-10-CM | POA: Diagnosis not present

## 2016-05-05 DIAGNOSIS — B182 Chronic viral hepatitis C: Secondary | ICD-10-CM | POA: Diagnosis not present

## 2016-05-06 DIAGNOSIS — B182 Chronic viral hepatitis C: Secondary | ICD-10-CM | POA: Diagnosis not present

## 2016-05-07 DIAGNOSIS — B182 Chronic viral hepatitis C: Secondary | ICD-10-CM | POA: Diagnosis not present

## 2016-05-08 DIAGNOSIS — B182 Chronic viral hepatitis C: Secondary | ICD-10-CM | POA: Diagnosis not present

## 2016-05-09 DIAGNOSIS — B182 Chronic viral hepatitis C: Secondary | ICD-10-CM | POA: Diagnosis not present

## 2016-05-10 DIAGNOSIS — B182 Chronic viral hepatitis C: Secondary | ICD-10-CM | POA: Diagnosis not present

## 2016-05-11 DIAGNOSIS — B182 Chronic viral hepatitis C: Secondary | ICD-10-CM | POA: Diagnosis not present

## 2016-05-12 DIAGNOSIS — B182 Chronic viral hepatitis C: Secondary | ICD-10-CM | POA: Diagnosis not present

## 2016-05-13 DIAGNOSIS — B182 Chronic viral hepatitis C: Secondary | ICD-10-CM | POA: Diagnosis not present

## 2016-05-14 DIAGNOSIS — B182 Chronic viral hepatitis C: Secondary | ICD-10-CM | POA: Diagnosis not present

## 2016-05-15 DIAGNOSIS — B182 Chronic viral hepatitis C: Secondary | ICD-10-CM | POA: Diagnosis not present

## 2016-05-16 DIAGNOSIS — B182 Chronic viral hepatitis C: Secondary | ICD-10-CM | POA: Diagnosis not present

## 2016-05-17 DIAGNOSIS — B182 Chronic viral hepatitis C: Secondary | ICD-10-CM | POA: Diagnosis not present

## 2016-05-18 DIAGNOSIS — B182 Chronic viral hepatitis C: Secondary | ICD-10-CM | POA: Diagnosis not present

## 2016-05-19 DIAGNOSIS — B182 Chronic viral hepatitis C: Secondary | ICD-10-CM | POA: Diagnosis not present

## 2016-05-20 DIAGNOSIS — B182 Chronic viral hepatitis C: Secondary | ICD-10-CM | POA: Diagnosis not present

## 2016-05-21 DIAGNOSIS — B182 Chronic viral hepatitis C: Secondary | ICD-10-CM | POA: Diagnosis not present

## 2016-05-23 DIAGNOSIS — B182 Chronic viral hepatitis C: Secondary | ICD-10-CM | POA: Diagnosis not present

## 2016-05-24 DIAGNOSIS — B182 Chronic viral hepatitis C: Secondary | ICD-10-CM | POA: Diagnosis not present

## 2016-05-25 DIAGNOSIS — B182 Chronic viral hepatitis C: Secondary | ICD-10-CM | POA: Diagnosis not present

## 2016-05-26 DIAGNOSIS — B182 Chronic viral hepatitis C: Secondary | ICD-10-CM | POA: Diagnosis not present

## 2016-05-27 DIAGNOSIS — B182 Chronic viral hepatitis C: Secondary | ICD-10-CM | POA: Diagnosis not present

## 2016-05-28 DIAGNOSIS — B182 Chronic viral hepatitis C: Secondary | ICD-10-CM | POA: Diagnosis not present

## 2016-05-30 ENCOUNTER — Ambulatory Visit: Payer: Commercial Managed Care - HMO | Admitting: Family

## 2016-05-30 DIAGNOSIS — B182 Chronic viral hepatitis C: Secondary | ICD-10-CM | POA: Diagnosis not present

## 2016-05-31 DIAGNOSIS — B182 Chronic viral hepatitis C: Secondary | ICD-10-CM | POA: Diagnosis not present

## 2016-06-01 DIAGNOSIS — B182 Chronic viral hepatitis C: Secondary | ICD-10-CM | POA: Diagnosis not present

## 2016-06-02 DIAGNOSIS — B182 Chronic viral hepatitis C: Secondary | ICD-10-CM | POA: Diagnosis not present

## 2016-06-03 DIAGNOSIS — B182 Chronic viral hepatitis C: Secondary | ICD-10-CM | POA: Diagnosis not present

## 2016-06-04 DIAGNOSIS — B182 Chronic viral hepatitis C: Secondary | ICD-10-CM | POA: Diagnosis not present

## 2016-06-05 DIAGNOSIS — B182 Chronic viral hepatitis C: Secondary | ICD-10-CM | POA: Diagnosis not present

## 2016-06-06 DIAGNOSIS — B182 Chronic viral hepatitis C: Secondary | ICD-10-CM | POA: Diagnosis not present

## 2016-06-07 ENCOUNTER — Ambulatory Visit: Payer: Commercial Managed Care - HMO | Admitting: Family

## 2016-06-07 DIAGNOSIS — B182 Chronic viral hepatitis C: Secondary | ICD-10-CM | POA: Diagnosis not present

## 2016-06-08 DIAGNOSIS — B182 Chronic viral hepatitis C: Secondary | ICD-10-CM | POA: Diagnosis not present

## 2016-06-09 DIAGNOSIS — B182 Chronic viral hepatitis C: Secondary | ICD-10-CM | POA: Diagnosis not present

## 2016-06-10 DIAGNOSIS — B182 Chronic viral hepatitis C: Secondary | ICD-10-CM | POA: Diagnosis not present

## 2016-06-11 DIAGNOSIS — B182 Chronic viral hepatitis C: Secondary | ICD-10-CM | POA: Diagnosis not present

## 2016-06-12 DIAGNOSIS — B182 Chronic viral hepatitis C: Secondary | ICD-10-CM | POA: Diagnosis not present

## 2016-06-13 ENCOUNTER — Telehealth: Payer: Self-pay | Admitting: Family

## 2016-06-13 ENCOUNTER — Ambulatory Visit: Payer: Medicare HMO | Admitting: Family

## 2016-06-13 DIAGNOSIS — B182 Chronic viral hepatitis C: Secondary | ICD-10-CM | POA: Diagnosis not present

## 2016-06-13 NOTE — Telephone Encounter (Signed)
No charge. 

## 2016-06-13 NOTE — Telephone Encounter (Signed)
Patient lvm cancelling 9:30am appointment today due to the passing of he's aunt, charge or no charge

## 2016-06-14 DIAGNOSIS — B182 Chronic viral hepatitis C: Secondary | ICD-10-CM | POA: Diagnosis not present

## 2016-06-16 DIAGNOSIS — B182 Chronic viral hepatitis C: Secondary | ICD-10-CM | POA: Diagnosis not present

## 2016-06-17 DIAGNOSIS — B182 Chronic viral hepatitis C: Secondary | ICD-10-CM | POA: Diagnosis not present

## 2016-06-18 DIAGNOSIS — B182 Chronic viral hepatitis C: Secondary | ICD-10-CM | POA: Diagnosis not present

## 2016-06-19 DIAGNOSIS — B182 Chronic viral hepatitis C: Secondary | ICD-10-CM | POA: Diagnosis not present

## 2016-06-20 DIAGNOSIS — B182 Chronic viral hepatitis C: Secondary | ICD-10-CM | POA: Diagnosis not present

## 2016-06-21 DIAGNOSIS — B182 Chronic viral hepatitis C: Secondary | ICD-10-CM | POA: Diagnosis not present

## 2016-06-22 DIAGNOSIS — B182 Chronic viral hepatitis C: Secondary | ICD-10-CM | POA: Diagnosis not present

## 2016-06-23 DIAGNOSIS — B182 Chronic viral hepatitis C: Secondary | ICD-10-CM | POA: Diagnosis not present

## 2016-06-24 DIAGNOSIS — B182 Chronic viral hepatitis C: Secondary | ICD-10-CM | POA: Diagnosis not present

## 2016-06-25 DIAGNOSIS — B182 Chronic viral hepatitis C: Secondary | ICD-10-CM | POA: Diagnosis not present

## 2016-06-26 DIAGNOSIS — B182 Chronic viral hepatitis C: Secondary | ICD-10-CM | POA: Diagnosis not present

## 2016-06-27 DIAGNOSIS — B182 Chronic viral hepatitis C: Secondary | ICD-10-CM | POA: Diagnosis not present

## 2016-06-28 DIAGNOSIS — B182 Chronic viral hepatitis C: Secondary | ICD-10-CM | POA: Diagnosis not present

## 2016-06-29 DIAGNOSIS — B182 Chronic viral hepatitis C: Secondary | ICD-10-CM | POA: Diagnosis not present

## 2016-06-30 DIAGNOSIS — B182 Chronic viral hepatitis C: Secondary | ICD-10-CM | POA: Diagnosis not present

## 2016-07-01 DIAGNOSIS — B182 Chronic viral hepatitis C: Secondary | ICD-10-CM | POA: Diagnosis not present

## 2016-07-02 DIAGNOSIS — B182 Chronic viral hepatitis C: Secondary | ICD-10-CM | POA: Diagnosis not present

## 2016-07-03 DIAGNOSIS — B182 Chronic viral hepatitis C: Secondary | ICD-10-CM | POA: Diagnosis not present

## 2016-07-04 ENCOUNTER — Ambulatory Visit: Payer: Medicare HMO | Admitting: *Deleted

## 2016-07-04 DIAGNOSIS — B182 Chronic viral hepatitis C: Secondary | ICD-10-CM | POA: Diagnosis not present
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
## 2016-07-04 NOTE — Progress Notes (Deleted)
Pre visit review using our clinic review tool, if applicable. No additional management support is needed unless otherwise documented below in the visit note. 

## 2016-07-05 DIAGNOSIS — B182 Chronic viral hepatitis C: Secondary | ICD-10-CM | POA: Diagnosis not present

## 2016-07-06 DIAGNOSIS — B182 Chronic viral hepatitis C: Secondary | ICD-10-CM | POA: Diagnosis not present

## 2016-07-07 DIAGNOSIS — B182 Chronic viral hepatitis C: Secondary | ICD-10-CM | POA: Diagnosis not present

## 2016-07-08 DIAGNOSIS — B182 Chronic viral hepatitis C: Secondary | ICD-10-CM | POA: Diagnosis not present

## 2016-07-09 DIAGNOSIS — B182 Chronic viral hepatitis C: Secondary | ICD-10-CM | POA: Diagnosis not present

## 2016-07-10 DIAGNOSIS — B182 Chronic viral hepatitis C: Secondary | ICD-10-CM | POA: Diagnosis not present

## 2016-07-11 DIAGNOSIS — B182 Chronic viral hepatitis C: Secondary | ICD-10-CM | POA: Diagnosis not present

## 2016-07-12 DIAGNOSIS — B182 Chronic viral hepatitis C: Secondary | ICD-10-CM | POA: Diagnosis not present

## 2016-07-13 ENCOUNTER — Other Ambulatory Visit: Payer: Self-pay | Admitting: Family

## 2016-07-13 DIAGNOSIS — B182 Chronic viral hepatitis C: Secondary | ICD-10-CM | POA: Diagnosis not present

## 2016-07-14 DIAGNOSIS — B182 Chronic viral hepatitis C: Secondary | ICD-10-CM | POA: Diagnosis not present

## 2016-07-15 DIAGNOSIS — B182 Chronic viral hepatitis C: Secondary | ICD-10-CM | POA: Diagnosis not present

## 2016-07-16 DIAGNOSIS — B182 Chronic viral hepatitis C: Secondary | ICD-10-CM | POA: Diagnosis not present

## 2016-07-17 DIAGNOSIS — B182 Chronic viral hepatitis C: Secondary | ICD-10-CM | POA: Diagnosis not present

## 2016-07-18 DIAGNOSIS — B182 Chronic viral hepatitis C: Secondary | ICD-10-CM | POA: Diagnosis not present

## 2016-07-19 DIAGNOSIS — B182 Chronic viral hepatitis C: Secondary | ICD-10-CM | POA: Diagnosis not present

## 2016-07-20 DIAGNOSIS — B182 Chronic viral hepatitis C: Secondary | ICD-10-CM | POA: Diagnosis not present

## 2016-07-21 DIAGNOSIS — B182 Chronic viral hepatitis C: Secondary | ICD-10-CM | POA: Diagnosis not present

## 2016-07-22 DIAGNOSIS — B182 Chronic viral hepatitis C: Secondary | ICD-10-CM | POA: Diagnosis not present

## 2016-07-23 DIAGNOSIS — B182 Chronic viral hepatitis C: Secondary | ICD-10-CM | POA: Diagnosis not present

## 2016-07-24 DIAGNOSIS — B182 Chronic viral hepatitis C: Secondary | ICD-10-CM | POA: Diagnosis not present

## 2016-07-25 DIAGNOSIS — B182 Chronic viral hepatitis C: Secondary | ICD-10-CM | POA: Diagnosis not present

## 2016-07-26 DIAGNOSIS — B182 Chronic viral hepatitis C: Secondary | ICD-10-CM | POA: Diagnosis not present

## 2016-07-27 DIAGNOSIS — B182 Chronic viral hepatitis C: Secondary | ICD-10-CM | POA: Diagnosis not present

## 2016-07-28 DIAGNOSIS — B182 Chronic viral hepatitis C: Secondary | ICD-10-CM | POA: Diagnosis not present

## 2016-07-29 DIAGNOSIS — B182 Chronic viral hepatitis C: Secondary | ICD-10-CM | POA: Diagnosis not present

## 2016-07-30 DIAGNOSIS — B182 Chronic viral hepatitis C: Secondary | ICD-10-CM | POA: Diagnosis not present

## 2016-07-31 DIAGNOSIS — B182 Chronic viral hepatitis C: Secondary | ICD-10-CM | POA: Diagnosis not present

## 2016-08-01 DIAGNOSIS — B182 Chronic viral hepatitis C: Secondary | ICD-10-CM | POA: Diagnosis not present

## 2016-08-02 DIAGNOSIS — B182 Chronic viral hepatitis C: Secondary | ICD-10-CM | POA: Diagnosis not present

## 2016-08-03 DIAGNOSIS — B182 Chronic viral hepatitis C: Secondary | ICD-10-CM | POA: Diagnosis not present

## 2016-08-04 DIAGNOSIS — B182 Chronic viral hepatitis C: Secondary | ICD-10-CM | POA: Diagnosis not present

## 2016-08-05 DIAGNOSIS — B182 Chronic viral hepatitis C: Secondary | ICD-10-CM | POA: Diagnosis not present

## 2016-08-06 DIAGNOSIS — B182 Chronic viral hepatitis C: Secondary | ICD-10-CM | POA: Diagnosis not present

## 2016-08-07 DIAGNOSIS — B182 Chronic viral hepatitis C: Secondary | ICD-10-CM | POA: Diagnosis not present

## 2016-08-08 DIAGNOSIS — B182 Chronic viral hepatitis C: Secondary | ICD-10-CM | POA: Diagnosis not present

## 2016-08-09 DIAGNOSIS — B182 Chronic viral hepatitis C: Secondary | ICD-10-CM | POA: Diagnosis not present

## 2016-08-10 DIAGNOSIS — B182 Chronic viral hepatitis C: Secondary | ICD-10-CM | POA: Diagnosis not present

## 2016-08-11 DIAGNOSIS — B182 Chronic viral hepatitis C: Secondary | ICD-10-CM | POA: Diagnosis not present

## 2016-08-12 ENCOUNTER — Other Ambulatory Visit: Payer: Self-pay | Admitting: Family

## 2016-08-12 DIAGNOSIS — B182 Chronic viral hepatitis C: Secondary | ICD-10-CM | POA: Diagnosis not present

## 2016-08-13 DIAGNOSIS — B182 Chronic viral hepatitis C: Secondary | ICD-10-CM | POA: Diagnosis not present

## 2016-08-14 DIAGNOSIS — B182 Chronic viral hepatitis C: Secondary | ICD-10-CM | POA: Diagnosis not present

## 2016-08-15 ENCOUNTER — Ambulatory Visit: Payer: Medicare HMO | Admitting: Family

## 2016-08-15 DIAGNOSIS — B182 Chronic viral hepatitis C: Secondary | ICD-10-CM | POA: Diagnosis not present

## 2016-08-15 NOTE — Telephone Encounter (Signed)
Gabapentin denial was sent to pharmacy yesterday. Pt was due for 6 month follow up in November.   04/28/16  No show 05/30/16  Patient cancelled 06/13/16 no showed due to death in family 07/04/16 no show 08/15/16 scheduled and no showed on the same day  Please advise?

## 2016-08-15 NOTE — Telephone Encounter (Signed)
Agree with medication denial.

## 2016-08-16 ENCOUNTER — Encounter: Payer: Self-pay | Admitting: Family

## 2016-08-16 DIAGNOSIS — B182 Chronic viral hepatitis C: Secondary | ICD-10-CM | POA: Diagnosis not present

## 2016-08-17 DIAGNOSIS — B182 Chronic viral hepatitis C: Secondary | ICD-10-CM | POA: Diagnosis not present

## 2016-08-18 ENCOUNTER — Telehealth: Payer: Self-pay | Admitting: Family

## 2016-08-18 DIAGNOSIS — B182 Chronic viral hepatitis C: Secondary | ICD-10-CM | POA: Diagnosis not present

## 2016-08-18 NOTE — Telephone Encounter (Signed)
Patient dismissed from [facility] by Sandford CrazeMelissa O'Sullivan NP , effective August 16, 2016. Dismissal letter sent out by certified / registered mail.  DAJ

## 2016-08-19 DIAGNOSIS — B182 Chronic viral hepatitis C: Secondary | ICD-10-CM | POA: Diagnosis not present

## 2016-08-20 DIAGNOSIS — B182 Chronic viral hepatitis C: Secondary | ICD-10-CM | POA: Diagnosis not present

## 2016-08-21 DIAGNOSIS — B182 Chronic viral hepatitis C: Secondary | ICD-10-CM | POA: Diagnosis not present

## 2016-08-22 DIAGNOSIS — B182 Chronic viral hepatitis C: Secondary | ICD-10-CM | POA: Diagnosis not present

## 2016-08-23 DIAGNOSIS — B182 Chronic viral hepatitis C: Secondary | ICD-10-CM | POA: Diagnosis not present

## 2016-08-24 DIAGNOSIS — B182 Chronic viral hepatitis C: Secondary | ICD-10-CM | POA: Diagnosis not present

## 2016-08-25 DIAGNOSIS — B182 Chronic viral hepatitis C: Secondary | ICD-10-CM | POA: Diagnosis not present

## 2016-08-26 DIAGNOSIS — B182 Chronic viral hepatitis C: Secondary | ICD-10-CM | POA: Diagnosis not present

## 2016-08-27 DIAGNOSIS — B182 Chronic viral hepatitis C: Secondary | ICD-10-CM | POA: Diagnosis not present

## 2016-08-28 DIAGNOSIS — B182 Chronic viral hepatitis C: Secondary | ICD-10-CM | POA: Diagnosis not present

## 2016-08-29 DIAGNOSIS — B182 Chronic viral hepatitis C: Secondary | ICD-10-CM | POA: Diagnosis not present

## 2016-08-30 DIAGNOSIS — B182 Chronic viral hepatitis C: Secondary | ICD-10-CM | POA: Diagnosis not present

## 2016-08-31 DIAGNOSIS — B182 Chronic viral hepatitis C: Secondary | ICD-10-CM | POA: Diagnosis not present

## 2016-09-01 DIAGNOSIS — B182 Chronic viral hepatitis C: Secondary | ICD-10-CM | POA: Diagnosis not present

## 2016-09-02 DIAGNOSIS — B182 Chronic viral hepatitis C: Secondary | ICD-10-CM | POA: Diagnosis not present

## 2016-09-03 DIAGNOSIS — B182 Chronic viral hepatitis C: Secondary | ICD-10-CM | POA: Diagnosis not present

## 2016-09-04 DIAGNOSIS — B182 Chronic viral hepatitis C: Secondary | ICD-10-CM | POA: Diagnosis not present

## 2016-09-05 DIAGNOSIS — B182 Chronic viral hepatitis C: Secondary | ICD-10-CM | POA: Diagnosis not present

## 2016-09-06 DIAGNOSIS — B182 Chronic viral hepatitis C: Secondary | ICD-10-CM | POA: Diagnosis not present

## 2016-09-07 DIAGNOSIS — B182 Chronic viral hepatitis C: Secondary | ICD-10-CM | POA: Diagnosis not present

## 2016-09-08 DIAGNOSIS — B182 Chronic viral hepatitis C: Secondary | ICD-10-CM | POA: Diagnosis not present

## 2016-09-09 DIAGNOSIS — B182 Chronic viral hepatitis C: Secondary | ICD-10-CM | POA: Diagnosis not present

## 2016-09-10 DIAGNOSIS — B182 Chronic viral hepatitis C: Secondary | ICD-10-CM | POA: Diagnosis not present

## 2016-09-11 DIAGNOSIS — B182 Chronic viral hepatitis C: Secondary | ICD-10-CM | POA: Diagnosis not present

## 2016-09-12 DIAGNOSIS — B182 Chronic viral hepatitis C: Secondary | ICD-10-CM | POA: Diagnosis not present

## 2016-09-13 DIAGNOSIS — B182 Chronic viral hepatitis C: Secondary | ICD-10-CM | POA: Diagnosis not present

## 2016-09-14 DIAGNOSIS — B182 Chronic viral hepatitis C: Secondary | ICD-10-CM | POA: Diagnosis not present

## 2016-09-15 DIAGNOSIS — B182 Chronic viral hepatitis C: Secondary | ICD-10-CM | POA: Diagnosis not present

## 2016-09-16 DIAGNOSIS — B182 Chronic viral hepatitis C: Secondary | ICD-10-CM | POA: Diagnosis not present

## 2016-09-17 DIAGNOSIS — B182 Chronic viral hepatitis C: Secondary | ICD-10-CM | POA: Diagnosis not present

## 2016-09-18 DIAGNOSIS — B182 Chronic viral hepatitis C: Secondary | ICD-10-CM | POA: Diagnosis not present

## 2016-09-19 DIAGNOSIS — B182 Chronic viral hepatitis C: Secondary | ICD-10-CM | POA: Diagnosis not present

## 2016-09-20 DIAGNOSIS — B182 Chronic viral hepatitis C: Secondary | ICD-10-CM | POA: Diagnosis not present

## 2016-09-21 DIAGNOSIS — B182 Chronic viral hepatitis C: Secondary | ICD-10-CM | POA: Diagnosis not present

## 2016-09-21 NOTE — Telephone Encounter (Signed)
Certified dismissal letter returned as undeliverable, unclaimed, return to sender after three attempts by [employer] on September 21, 2016. Letter placed in another envelope and resent as 1st class mail which does not require a signature. daj

## 2016-09-22 DIAGNOSIS — B182 Chronic viral hepatitis C: Secondary | ICD-10-CM | POA: Diagnosis not present

## 2016-09-23 DIAGNOSIS — B182 Chronic viral hepatitis C: Secondary | ICD-10-CM | POA: Diagnosis not present

## 2016-09-24 DIAGNOSIS — B182 Chronic viral hepatitis C: Secondary | ICD-10-CM | POA: Diagnosis not present

## 2016-09-25 DIAGNOSIS — B182 Chronic viral hepatitis C: Secondary | ICD-10-CM | POA: Diagnosis not present

## 2016-09-26 ENCOUNTER — Telehealth: Payer: Self-pay | Admitting: Family

## 2016-09-26 DIAGNOSIS — B182 Chronic viral hepatitis C: Secondary | ICD-10-CM | POA: Diagnosis not present

## 2016-09-26 NOTE — Telephone Encounter (Signed)
Noted.  Dismissal stands due to multiple missed appointments.

## 2016-09-26 NOTE — Telephone Encounter (Signed)
Pt called in  because he received his dismissal letter. Pt would like to make provider aware that he has been out of town and don't have a phone to be reached at. Pt has hopes that provider will not dismiss. I informed pt that Im showing that he has had 6 missed appt. Pt expressed understanding.

## 2016-09-27 DIAGNOSIS — B182 Chronic viral hepatitis C: Secondary | ICD-10-CM | POA: Diagnosis not present

## 2016-09-28 DIAGNOSIS — B182 Chronic viral hepatitis C: Secondary | ICD-10-CM | POA: Diagnosis not present

## 2016-09-29 DIAGNOSIS — B182 Chronic viral hepatitis C: Secondary | ICD-10-CM | POA: Diagnosis not present

## 2016-09-30 DIAGNOSIS — B182 Chronic viral hepatitis C: Secondary | ICD-10-CM | POA: Diagnosis not present

## 2016-10-01 DIAGNOSIS — B182 Chronic viral hepatitis C: Secondary | ICD-10-CM | POA: Diagnosis not present

## 2016-10-02 DIAGNOSIS — B182 Chronic viral hepatitis C: Secondary | ICD-10-CM | POA: Diagnosis not present

## 2016-10-03 DIAGNOSIS — B182 Chronic viral hepatitis C: Secondary | ICD-10-CM | POA: Diagnosis not present

## 2016-10-04 DIAGNOSIS — B182 Chronic viral hepatitis C: Secondary | ICD-10-CM | POA: Diagnosis not present

## 2016-10-05 DIAGNOSIS — B182 Chronic viral hepatitis C: Secondary | ICD-10-CM | POA: Diagnosis not present

## 2016-10-06 DIAGNOSIS — B182 Chronic viral hepatitis C: Secondary | ICD-10-CM | POA: Diagnosis not present

## 2016-10-07 DIAGNOSIS — B182 Chronic viral hepatitis C: Secondary | ICD-10-CM | POA: Diagnosis not present

## 2016-10-08 DIAGNOSIS — B182 Chronic viral hepatitis C: Secondary | ICD-10-CM | POA: Diagnosis not present

## 2016-10-09 DIAGNOSIS — B182 Chronic viral hepatitis C: Secondary | ICD-10-CM | POA: Diagnosis not present

## 2016-10-10 DIAGNOSIS — B182 Chronic viral hepatitis C: Secondary | ICD-10-CM | POA: Diagnosis not present

## 2016-10-11 DIAGNOSIS — B182 Chronic viral hepatitis C: Secondary | ICD-10-CM | POA: Diagnosis not present

## 2016-10-12 DIAGNOSIS — B182 Chronic viral hepatitis C: Secondary | ICD-10-CM | POA: Diagnosis not present

## 2016-10-13 DIAGNOSIS — B182 Chronic viral hepatitis C: Secondary | ICD-10-CM | POA: Diagnosis not present

## 2016-10-14 DIAGNOSIS — B182 Chronic viral hepatitis C: Secondary | ICD-10-CM | POA: Diagnosis not present

## 2016-10-15 DIAGNOSIS — B182 Chronic viral hepatitis C: Secondary | ICD-10-CM | POA: Diagnosis not present

## 2016-10-16 DIAGNOSIS — B182 Chronic viral hepatitis C: Secondary | ICD-10-CM | POA: Diagnosis not present

## 2016-10-17 DIAGNOSIS — B182 Chronic viral hepatitis C: Secondary | ICD-10-CM | POA: Diagnosis not present

## 2016-10-18 DIAGNOSIS — B182 Chronic viral hepatitis C: Secondary | ICD-10-CM | POA: Diagnosis not present

## 2016-10-19 DIAGNOSIS — B182 Chronic viral hepatitis C: Secondary | ICD-10-CM | POA: Diagnosis not present

## 2016-10-20 DIAGNOSIS — B182 Chronic viral hepatitis C: Secondary | ICD-10-CM | POA: Diagnosis not present

## 2016-10-21 DIAGNOSIS — B182 Chronic viral hepatitis C: Secondary | ICD-10-CM | POA: Diagnosis not present

## 2016-10-22 DIAGNOSIS — B182 Chronic viral hepatitis C: Secondary | ICD-10-CM | POA: Diagnosis not present

## 2016-10-23 DIAGNOSIS — B182 Chronic viral hepatitis C: Secondary | ICD-10-CM | POA: Diagnosis not present

## 2016-10-24 DIAGNOSIS — B182 Chronic viral hepatitis C: Secondary | ICD-10-CM | POA: Diagnosis not present

## 2016-10-25 DIAGNOSIS — B182 Chronic viral hepatitis C: Secondary | ICD-10-CM | POA: Diagnosis not present

## 2016-10-26 DIAGNOSIS — B182 Chronic viral hepatitis C: Secondary | ICD-10-CM | POA: Diagnosis not present

## 2016-10-27 DIAGNOSIS — B182 Chronic viral hepatitis C: Secondary | ICD-10-CM | POA: Diagnosis not present

## 2016-10-28 DIAGNOSIS — B182 Chronic viral hepatitis C: Secondary | ICD-10-CM | POA: Diagnosis not present

## 2016-10-29 DIAGNOSIS — B182 Chronic viral hepatitis C: Secondary | ICD-10-CM | POA: Diagnosis not present

## 2016-10-30 DIAGNOSIS — B182 Chronic viral hepatitis C: Secondary | ICD-10-CM | POA: Diagnosis not present

## 2016-10-30 IMAGING — CR DG HAND COMPLETE 3+V*L*
3 series · 3 of 3 positions shown · non-contrast
Comparison: Left hand radiographs from earlier today

CLINICAL DATA: Golf club injury to the left hand 2 days prior with
first and second metacarpal region pain and swelling.

EXAM:
LEFT HAND - COMPLETE 3+ VIEW

[x hand pa left]
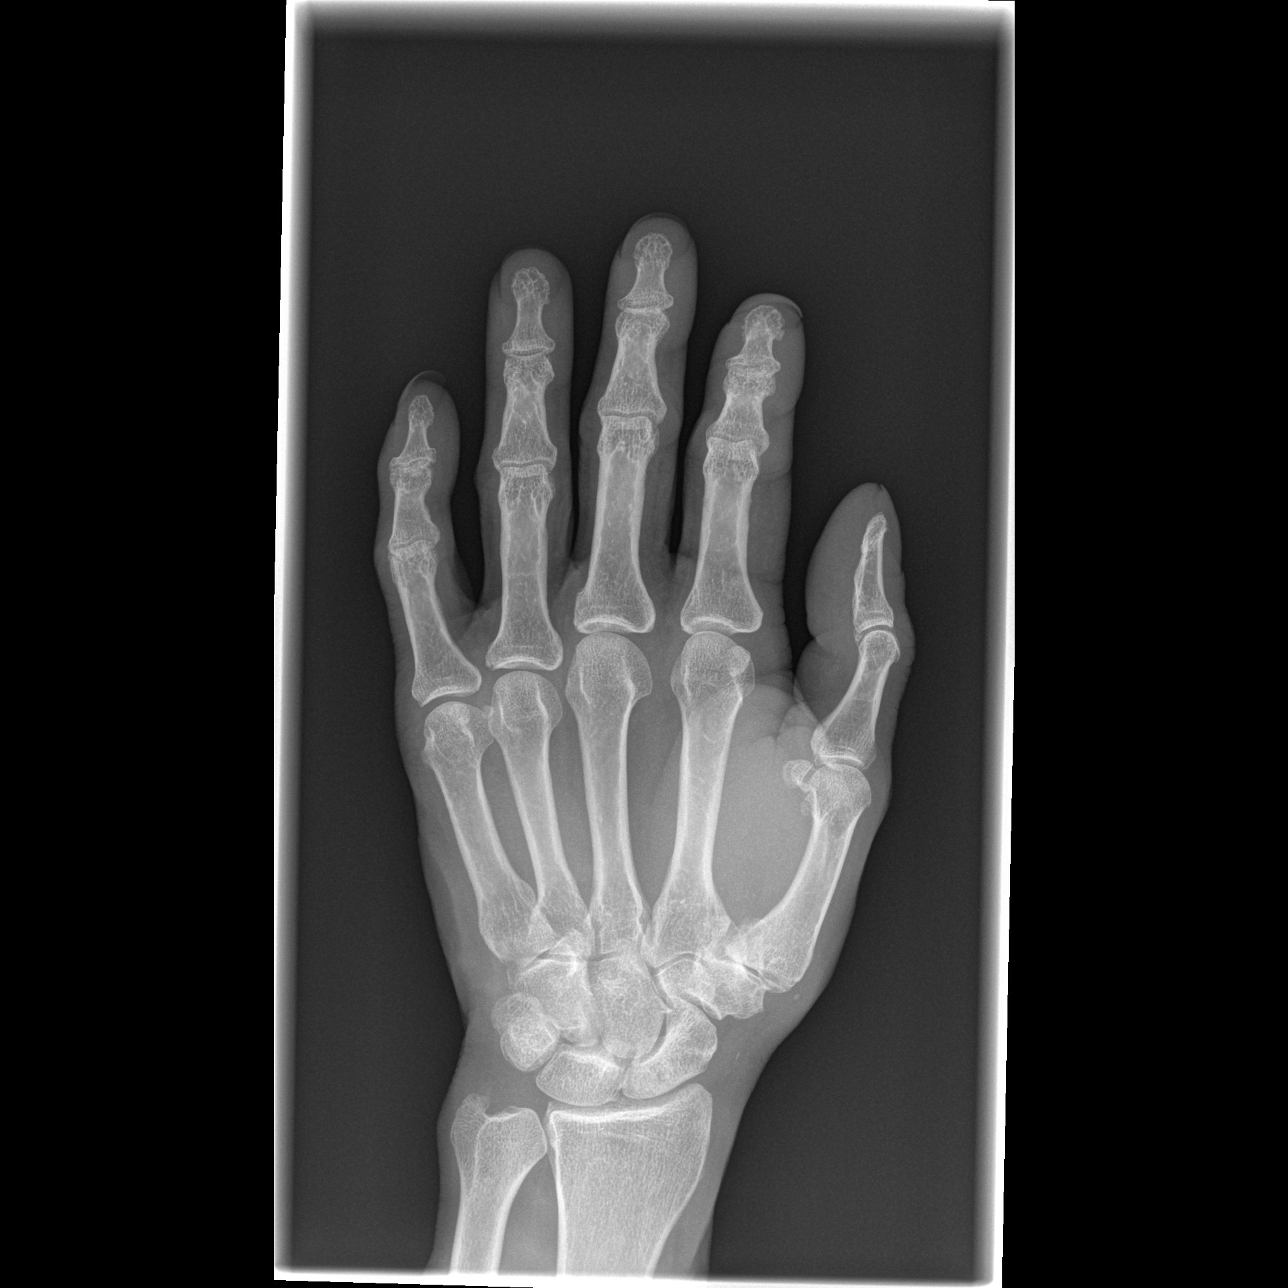

[x hand oblique left]
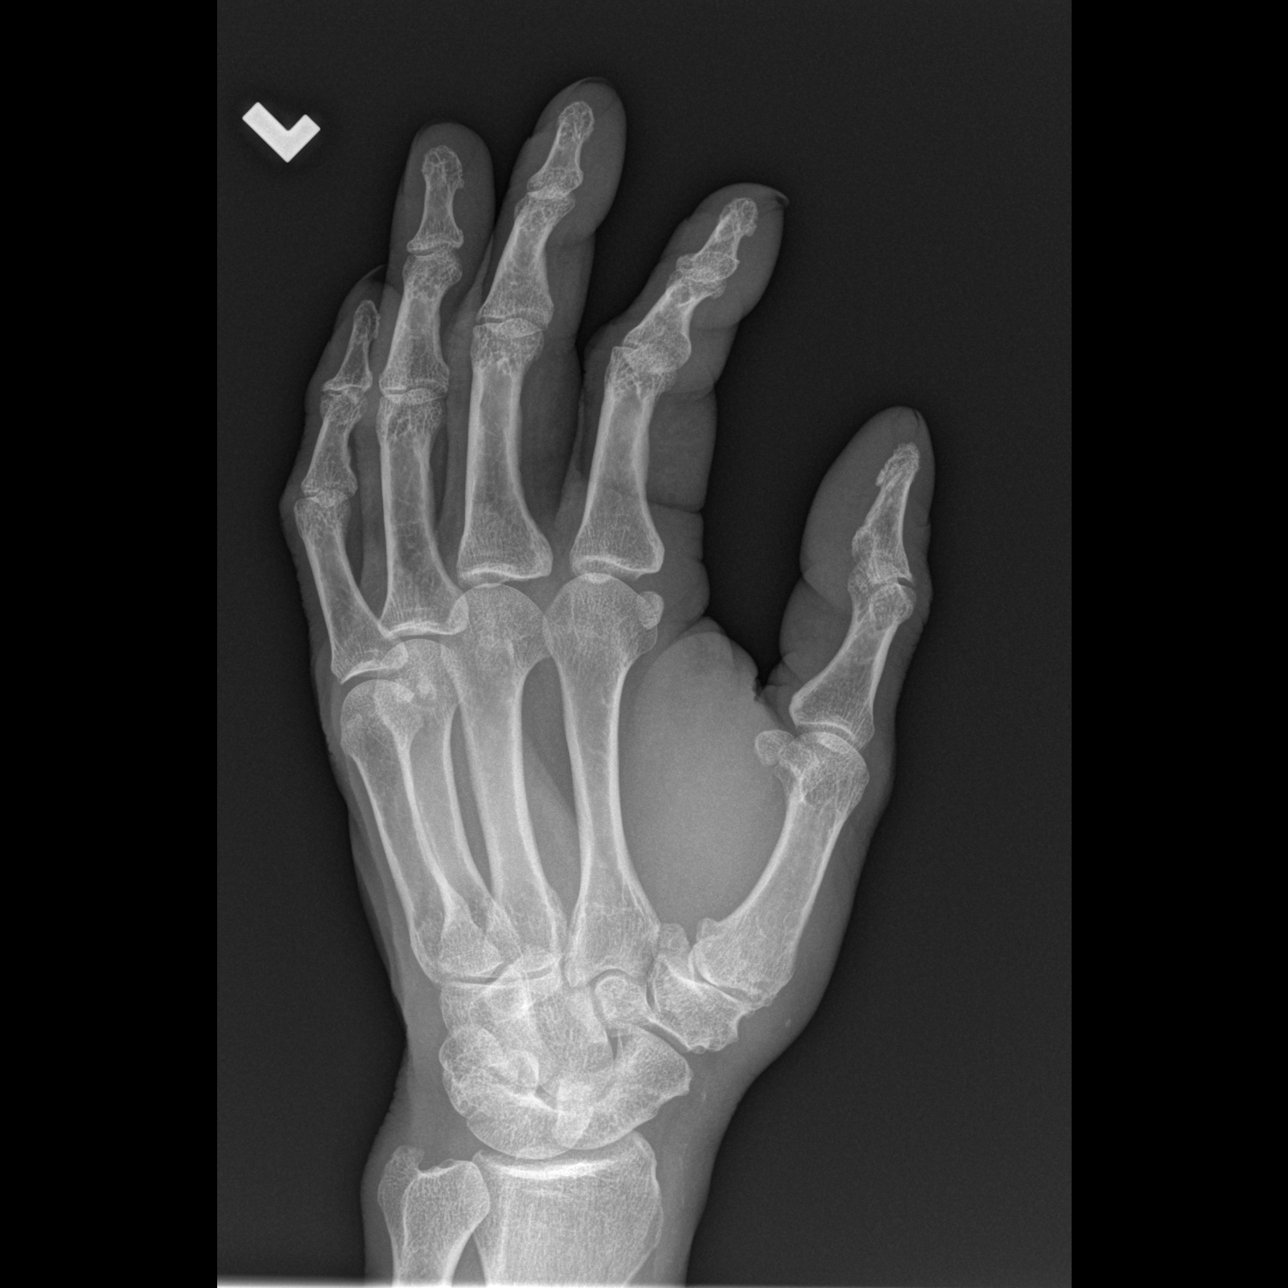

[x hand lat left]
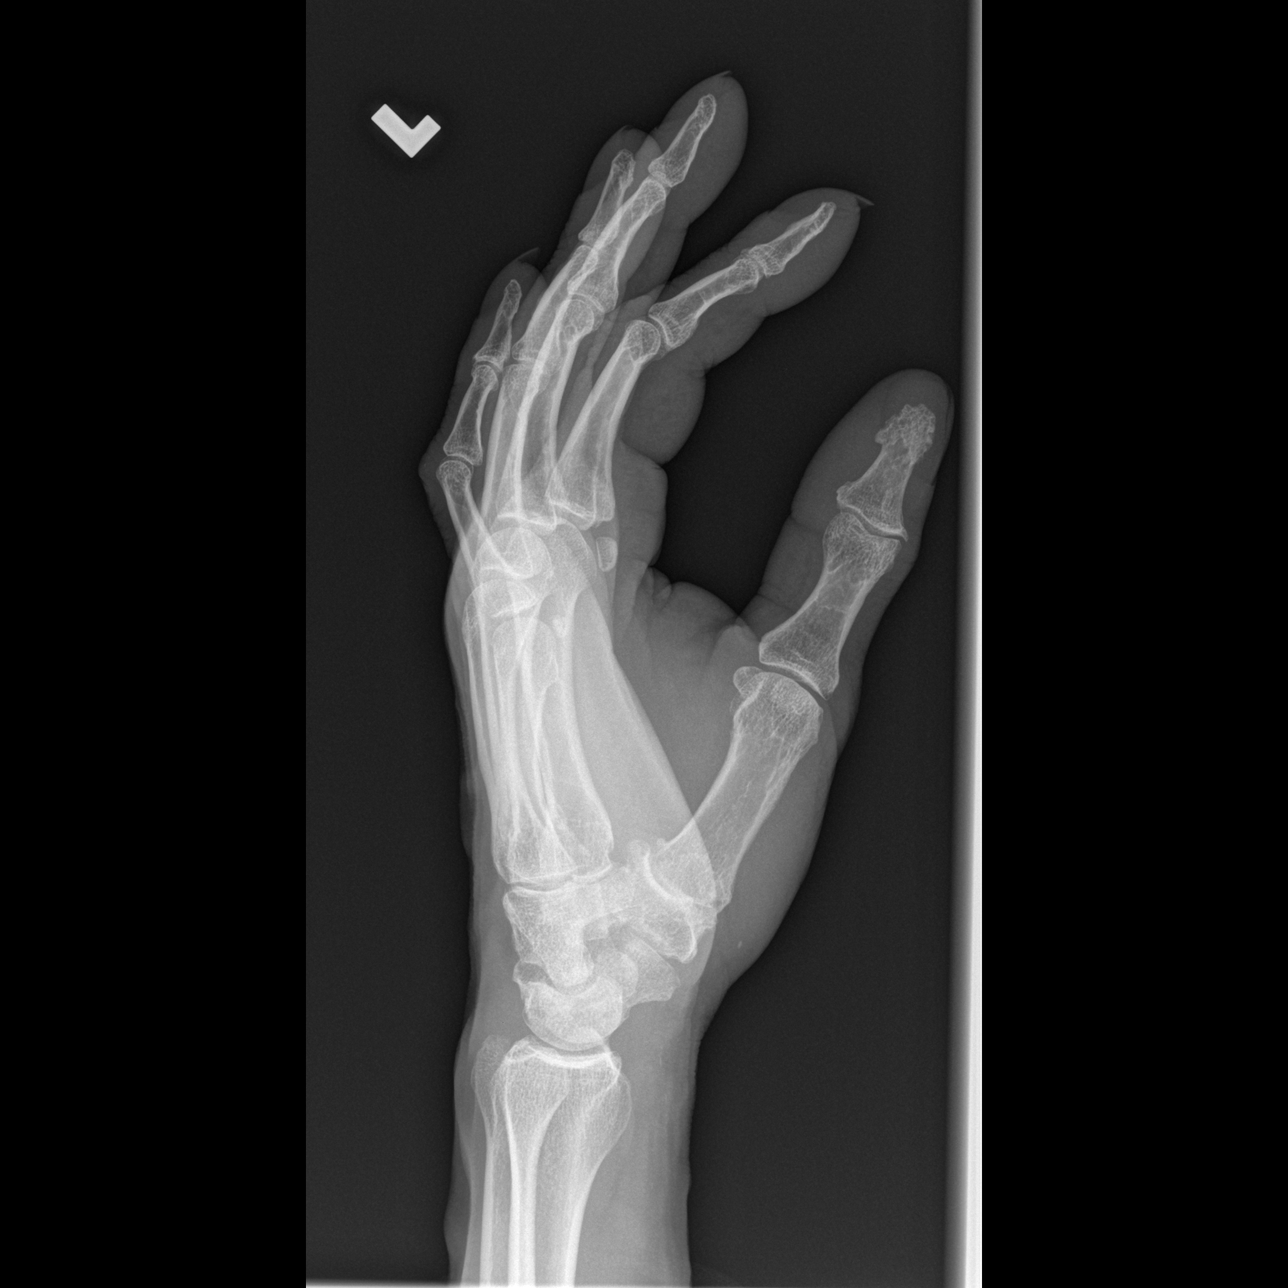

[3 of 3 positions shown; findings below may reference images not displayed]

FINDINGS: No fracture, dislocation or suspicious focal osseous lesion.
Moderate osteoarthritis at the first carpometacarpal joint. Mild
osteoarthritis in the interphalangeal joint of the left thumb. Mild
osteoarthritis in the second and fifth distal interphalangeal
joints.
IMPRESSION: 1. No fracture or malalignment in the left hand.
2. Mild to moderate polyarticular osteoarthritis as described.

## 2016-10-31 DIAGNOSIS — B182 Chronic viral hepatitis C: Secondary | ICD-10-CM | POA: Diagnosis not present

## 2016-11-01 DIAGNOSIS — B182 Chronic viral hepatitis C: Secondary | ICD-10-CM | POA: Diagnosis not present

## 2016-11-02 DIAGNOSIS — B182 Chronic viral hepatitis C: Secondary | ICD-10-CM | POA: Diagnosis not present

## 2016-11-03 DIAGNOSIS — B182 Chronic viral hepatitis C: Secondary | ICD-10-CM | POA: Diagnosis not present

## 2016-11-04 DIAGNOSIS — B182 Chronic viral hepatitis C: Secondary | ICD-10-CM | POA: Diagnosis not present

## 2016-11-05 DIAGNOSIS — B182 Chronic viral hepatitis C: Secondary | ICD-10-CM | POA: Diagnosis not present

## 2016-11-06 DIAGNOSIS — B182 Chronic viral hepatitis C: Secondary | ICD-10-CM | POA: Diagnosis not present

## 2016-11-07 DIAGNOSIS — B182 Chronic viral hepatitis C: Secondary | ICD-10-CM | POA: Diagnosis not present

## 2016-11-08 DIAGNOSIS — B182 Chronic viral hepatitis C: Secondary | ICD-10-CM | POA: Diagnosis not present

## 2016-11-09 DIAGNOSIS — B182 Chronic viral hepatitis C: Secondary | ICD-10-CM | POA: Diagnosis not present

## 2016-11-10 DIAGNOSIS — B182 Chronic viral hepatitis C: Secondary | ICD-10-CM | POA: Diagnosis not present

## 2016-11-11 DIAGNOSIS — B182 Chronic viral hepatitis C: Secondary | ICD-10-CM | POA: Diagnosis not present

## 2016-11-12 DIAGNOSIS — B182 Chronic viral hepatitis C: Secondary | ICD-10-CM | POA: Diagnosis not present

## 2016-11-13 DIAGNOSIS — B182 Chronic viral hepatitis C: Secondary | ICD-10-CM | POA: Diagnosis not present

## 2016-11-14 DIAGNOSIS — B182 Chronic viral hepatitis C: Secondary | ICD-10-CM | POA: Diagnosis not present

## 2016-11-15 DIAGNOSIS — B182 Chronic viral hepatitis C: Secondary | ICD-10-CM | POA: Diagnosis not present

## 2016-11-16 DIAGNOSIS — B182 Chronic viral hepatitis C: Secondary | ICD-10-CM | POA: Diagnosis not present

## 2016-11-17 DIAGNOSIS — B182 Chronic viral hepatitis C: Secondary | ICD-10-CM | POA: Diagnosis not present

## 2016-11-18 DIAGNOSIS — B182 Chronic viral hepatitis C: Secondary | ICD-10-CM | POA: Diagnosis not present

## 2016-11-19 DIAGNOSIS — B182 Chronic viral hepatitis C: Secondary | ICD-10-CM | POA: Diagnosis not present

## 2016-11-20 DIAGNOSIS — B182 Chronic viral hepatitis C: Secondary | ICD-10-CM | POA: Diagnosis not present

## 2016-11-21 DIAGNOSIS — B182 Chronic viral hepatitis C: Secondary | ICD-10-CM | POA: Diagnosis not present

## 2016-11-22 DIAGNOSIS — B182 Chronic viral hepatitis C: Secondary | ICD-10-CM | POA: Diagnosis not present

## 2016-11-23 DIAGNOSIS — B182 Chronic viral hepatitis C: Secondary | ICD-10-CM | POA: Diagnosis not present

## 2016-11-24 DIAGNOSIS — B182 Chronic viral hepatitis C: Secondary | ICD-10-CM | POA: Diagnosis not present

## 2016-11-25 DIAGNOSIS — B182 Chronic viral hepatitis C: Secondary | ICD-10-CM | POA: Diagnosis not present

## 2016-11-26 DIAGNOSIS — B182 Chronic viral hepatitis C: Secondary | ICD-10-CM | POA: Diagnosis not present

## 2016-11-27 DIAGNOSIS — B182 Chronic viral hepatitis C: Secondary | ICD-10-CM | POA: Diagnosis not present

## 2016-11-28 DIAGNOSIS — B182 Chronic viral hepatitis C: Secondary | ICD-10-CM | POA: Diagnosis not present

## 2016-11-30 DIAGNOSIS — B182 Chronic viral hepatitis C: Secondary | ICD-10-CM | POA: Diagnosis not present

## 2016-12-01 DIAGNOSIS — B182 Chronic viral hepatitis C: Secondary | ICD-10-CM | POA: Diagnosis not present

## 2016-12-02 DIAGNOSIS — B182 Chronic viral hepatitis C: Secondary | ICD-10-CM | POA: Diagnosis not present

## 2016-12-03 DIAGNOSIS — B182 Chronic viral hepatitis C: Secondary | ICD-10-CM | POA: Diagnosis not present

## 2016-12-04 DIAGNOSIS — B182 Chronic viral hepatitis C: Secondary | ICD-10-CM | POA: Diagnosis not present

## 2016-12-05 DIAGNOSIS — B182 Chronic viral hepatitis C: Secondary | ICD-10-CM | POA: Diagnosis not present

## 2016-12-06 DIAGNOSIS — B182 Chronic viral hepatitis C: Secondary | ICD-10-CM | POA: Diagnosis not present

## 2016-12-07 DIAGNOSIS — B182 Chronic viral hepatitis C: Secondary | ICD-10-CM | POA: Diagnosis not present

## 2016-12-08 DIAGNOSIS — B182 Chronic viral hepatitis C: Secondary | ICD-10-CM | POA: Diagnosis not present

## 2016-12-09 DIAGNOSIS — B182 Chronic viral hepatitis C: Secondary | ICD-10-CM | POA: Diagnosis not present

## 2016-12-10 DIAGNOSIS — B182 Chronic viral hepatitis C: Secondary | ICD-10-CM | POA: Diagnosis not present

## 2016-12-11 DIAGNOSIS — B182 Chronic viral hepatitis C: Secondary | ICD-10-CM | POA: Diagnosis not present

## 2016-12-12 DIAGNOSIS — B182 Chronic viral hepatitis C: Secondary | ICD-10-CM | POA: Diagnosis not present

## 2016-12-13 DIAGNOSIS — B182 Chronic viral hepatitis C: Secondary | ICD-10-CM | POA: Diagnosis not present

## 2016-12-14 DIAGNOSIS — B182 Chronic viral hepatitis C: Secondary | ICD-10-CM | POA: Diagnosis not present

## 2016-12-15 DIAGNOSIS — B182 Chronic viral hepatitis C: Secondary | ICD-10-CM | POA: Diagnosis not present

## 2016-12-16 DIAGNOSIS — B182 Chronic viral hepatitis C: Secondary | ICD-10-CM | POA: Diagnosis not present

## 2016-12-17 DIAGNOSIS — B182 Chronic viral hepatitis C: Secondary | ICD-10-CM | POA: Diagnosis not present

## 2016-12-18 ENCOUNTER — Telehealth: Payer: Self-pay | Admitting: General Practice

## 2016-12-18 DIAGNOSIS — B182 Chronic viral hepatitis C: Secondary | ICD-10-CM | POA: Diagnosis not present

## 2016-12-18 NOTE — Telephone Encounter (Signed)
Patient last seen by you 10/27/15 Dismissed 09/26/16 for multiple no-show appointments.    Please advise. pC

## 2016-12-18 NOTE — Telephone Encounter (Signed)
He can try calling his insurance to see who is in network. He may also call [facility] at [phone] and ask for the physician referral service.

## 2016-12-18 NOTE — Telephone Encounter (Signed)
Pt would like to know if previous provider could refer or suggest a new PCP for him in [place]. Pt was dismissed due to multiple missed apt.    CB: [phone]

## 2016-12-19 DIAGNOSIS — B182 Chronic viral hepatitis C: Secondary | ICD-10-CM | POA: Diagnosis not present

## 2016-12-20 DIAGNOSIS — B182 Chronic viral hepatitis C: Secondary | ICD-10-CM | POA: Diagnosis not present

## 2016-12-20 NOTE — Telephone Encounter (Signed)
Called patient and left a detailed message on phone of Melissa's recommendation in previous note.

## 2016-12-21 DIAGNOSIS — B182 Chronic viral hepatitis C: Secondary | ICD-10-CM | POA: Diagnosis not present

## 2016-12-22 DIAGNOSIS — B182 Chronic viral hepatitis C: Secondary | ICD-10-CM | POA: Diagnosis not present

## 2016-12-23 DIAGNOSIS — B182 Chronic viral hepatitis C: Secondary | ICD-10-CM | POA: Diagnosis not present

## 2016-12-24 DIAGNOSIS — B182 Chronic viral hepatitis C: Secondary | ICD-10-CM | POA: Diagnosis not present

## 2016-12-25 DIAGNOSIS — B182 Chronic viral hepatitis C: Secondary | ICD-10-CM | POA: Diagnosis not present

## 2016-12-26 DIAGNOSIS — B182 Chronic viral hepatitis C: Secondary | ICD-10-CM | POA: Diagnosis not present

## 2016-12-27 DIAGNOSIS — B182 Chronic viral hepatitis C: Secondary | ICD-10-CM | POA: Diagnosis not present

## 2016-12-28 DIAGNOSIS — B182 Chronic viral hepatitis C: Secondary | ICD-10-CM | POA: Diagnosis not present

## 2016-12-29 DIAGNOSIS — B182 Chronic viral hepatitis C: Secondary | ICD-10-CM | POA: Diagnosis not present

## 2016-12-30 DIAGNOSIS — B182 Chronic viral hepatitis C: Secondary | ICD-10-CM | POA: Diagnosis not present

## 2016-12-31 DIAGNOSIS — B182 Chronic viral hepatitis C: Secondary | ICD-10-CM | POA: Diagnosis not present

## 2017-01-01 DIAGNOSIS — B182 Chronic viral hepatitis C: Secondary | ICD-10-CM | POA: Diagnosis not present

## 2017-01-02 DIAGNOSIS — B182 Chronic viral hepatitis C: Secondary | ICD-10-CM | POA: Diagnosis not present

## 2017-01-03 DIAGNOSIS — B182 Chronic viral hepatitis C: Secondary | ICD-10-CM | POA: Diagnosis not present

## 2017-01-04 DIAGNOSIS — B182 Chronic viral hepatitis C: Secondary | ICD-10-CM | POA: Diagnosis not present

## 2017-01-05 DIAGNOSIS — B182 Chronic viral hepatitis C: Secondary | ICD-10-CM | POA: Diagnosis not present

## 2017-01-06 DIAGNOSIS — B182 Chronic viral hepatitis C: Secondary | ICD-10-CM | POA: Diagnosis not present

## 2017-01-07 DIAGNOSIS — B182 Chronic viral hepatitis C: Secondary | ICD-10-CM | POA: Diagnosis not present

## 2017-01-08 DIAGNOSIS — B182 Chronic viral hepatitis C: Secondary | ICD-10-CM | POA: Diagnosis not present

## 2017-01-09 DIAGNOSIS — B182 Chronic viral hepatitis C: Secondary | ICD-10-CM | POA: Diagnosis not present

## 2017-01-10 DIAGNOSIS — B182 Chronic viral hepatitis C: Secondary | ICD-10-CM | POA: Diagnosis not present

## 2017-01-11 DIAGNOSIS — B182 Chronic viral hepatitis C: Secondary | ICD-10-CM | POA: Diagnosis not present

## 2017-01-12 DIAGNOSIS — B182 Chronic viral hepatitis C: Secondary | ICD-10-CM | POA: Diagnosis not present

## 2017-01-13 DIAGNOSIS — B182 Chronic viral hepatitis C: Secondary | ICD-10-CM | POA: Diagnosis not present

## 2017-01-14 DIAGNOSIS — B182 Chronic viral hepatitis C: Secondary | ICD-10-CM | POA: Diagnosis not present

## 2017-01-15 DIAGNOSIS — B182 Chronic viral hepatitis C: Secondary | ICD-10-CM | POA: Diagnosis not present

## 2017-01-16 DIAGNOSIS — B182 Chronic viral hepatitis C: Secondary | ICD-10-CM | POA: Diagnosis not present

## 2017-01-17 DIAGNOSIS — B182 Chronic viral hepatitis C: Secondary | ICD-10-CM | POA: Diagnosis not present

## 2017-01-18 DIAGNOSIS — B182 Chronic viral hepatitis C: Secondary | ICD-10-CM | POA: Diagnosis not present

## 2017-01-19 DIAGNOSIS — B182 Chronic viral hepatitis C: Secondary | ICD-10-CM | POA: Diagnosis not present

## 2017-01-20 DIAGNOSIS — B182 Chronic viral hepatitis C: Secondary | ICD-10-CM | POA: Diagnosis not present

## 2017-01-21 DIAGNOSIS — B182 Chronic viral hepatitis C: Secondary | ICD-10-CM | POA: Diagnosis not present

## 2017-01-22 DIAGNOSIS — B182 Chronic viral hepatitis C: Secondary | ICD-10-CM | POA: Diagnosis not present

## 2017-01-23 DIAGNOSIS — B182 Chronic viral hepatitis C: Secondary | ICD-10-CM | POA: Diagnosis not present

## 2017-01-24 DIAGNOSIS — B182 Chronic viral hepatitis C: Secondary | ICD-10-CM | POA: Diagnosis not present

## 2017-01-25 DIAGNOSIS — B182 Chronic viral hepatitis C: Secondary | ICD-10-CM | POA: Diagnosis not present

## 2017-01-26 DIAGNOSIS — B182 Chronic viral hepatitis C: Secondary | ICD-10-CM | POA: Diagnosis not present

## 2017-01-27 DIAGNOSIS — B182 Chronic viral hepatitis C: Secondary | ICD-10-CM | POA: Diagnosis not present

## 2017-01-28 DIAGNOSIS — B182 Chronic viral hepatitis C: Secondary | ICD-10-CM | POA: Diagnosis not present

## 2017-01-30 DIAGNOSIS — B182 Chronic viral hepatitis C: Secondary | ICD-10-CM | POA: Diagnosis not present

## 2017-01-31 DIAGNOSIS — B182 Chronic viral hepatitis C: Secondary | ICD-10-CM | POA: Diagnosis not present

## 2017-02-01 DIAGNOSIS — B182 Chronic viral hepatitis C: Secondary | ICD-10-CM | POA: Diagnosis not present

## 2017-02-02 DIAGNOSIS — B182 Chronic viral hepatitis C: Secondary | ICD-10-CM | POA: Diagnosis not present

## 2017-02-03 DIAGNOSIS — B182 Chronic viral hepatitis C: Secondary | ICD-10-CM | POA: Diagnosis not present

## 2017-02-04 DIAGNOSIS — B182 Chronic viral hepatitis C: Secondary | ICD-10-CM | POA: Diagnosis not present

## 2017-02-05 DIAGNOSIS — B182 Chronic viral hepatitis C: Secondary | ICD-10-CM | POA: Diagnosis not present

## 2017-02-06 DIAGNOSIS — B182 Chronic viral hepatitis C: Secondary | ICD-10-CM | POA: Diagnosis not present

## 2017-02-07 DIAGNOSIS — B182 Chronic viral hepatitis C: Secondary | ICD-10-CM | POA: Diagnosis not present

## 2017-02-08 DIAGNOSIS — B182 Chronic viral hepatitis C: Secondary | ICD-10-CM | POA: Diagnosis not present

## 2017-02-09 DIAGNOSIS — B182 Chronic viral hepatitis C: Secondary | ICD-10-CM | POA: Diagnosis not present

## 2017-02-10 DIAGNOSIS — B182 Chronic viral hepatitis C: Secondary | ICD-10-CM | POA: Diagnosis not present

## 2017-02-11 DIAGNOSIS — B182 Chronic viral hepatitis C: Secondary | ICD-10-CM | POA: Diagnosis not present

## 2017-02-12 DIAGNOSIS — B182 Chronic viral hepatitis C: Secondary | ICD-10-CM | POA: Diagnosis not present

## 2017-02-13 DIAGNOSIS — B182 Chronic viral hepatitis C: Secondary | ICD-10-CM | POA: Diagnosis not present

## 2017-02-14 DIAGNOSIS — B182 Chronic viral hepatitis C: Secondary | ICD-10-CM | POA: Diagnosis not present

## 2017-02-15 DIAGNOSIS — B182 Chronic viral hepatitis C: Secondary | ICD-10-CM | POA: Diagnosis not present

## 2017-02-16 DIAGNOSIS — B182 Chronic viral hepatitis C: Secondary | ICD-10-CM | POA: Diagnosis not present

## 2017-02-17 DIAGNOSIS — B182 Chronic viral hepatitis C: Secondary | ICD-10-CM | POA: Diagnosis not present

## 2017-02-18 DIAGNOSIS — B182 Chronic viral hepatitis C: Secondary | ICD-10-CM | POA: Diagnosis not present

## 2017-02-19 DIAGNOSIS — B182 Chronic viral hepatitis C: Secondary | ICD-10-CM | POA: Diagnosis not present

## 2017-02-20 DIAGNOSIS — B182 Chronic viral hepatitis C: Secondary | ICD-10-CM | POA: Diagnosis not present

## 2017-02-21 DIAGNOSIS — B182 Chronic viral hepatitis C: Secondary | ICD-10-CM | POA: Diagnosis not present

## 2017-02-22 DIAGNOSIS — B182 Chronic viral hepatitis C: Secondary | ICD-10-CM | POA: Diagnosis not present

## 2017-02-23 DIAGNOSIS — B182 Chronic viral hepatitis C: Secondary | ICD-10-CM | POA: Diagnosis not present

## 2017-02-24 DIAGNOSIS — B182 Chronic viral hepatitis C: Secondary | ICD-10-CM | POA: Diagnosis not present

## 2017-02-25 DIAGNOSIS — B182 Chronic viral hepatitis C: Secondary | ICD-10-CM | POA: Diagnosis not present

## 2017-02-26 DIAGNOSIS — B182 Chronic viral hepatitis C: Secondary | ICD-10-CM | POA: Diagnosis not present

## 2017-02-27 DIAGNOSIS — B182 Chronic viral hepatitis C: Secondary | ICD-10-CM | POA: Diagnosis not present

## 2017-02-28 DIAGNOSIS — B182 Chronic viral hepatitis C: Secondary | ICD-10-CM | POA: Diagnosis not present

## 2017-03-01 DIAGNOSIS — B182 Chronic viral hepatitis C: Secondary | ICD-10-CM | POA: Diagnosis not present

## 2017-03-02 DIAGNOSIS — B182 Chronic viral hepatitis C: Secondary | ICD-10-CM | POA: Diagnosis not present

## 2017-03-03 DIAGNOSIS — B182 Chronic viral hepatitis C: Secondary | ICD-10-CM | POA: Diagnosis not present

## 2017-03-04 DIAGNOSIS — B182 Chronic viral hepatitis C: Secondary | ICD-10-CM | POA: Diagnosis not present

## 2017-03-05 DIAGNOSIS — B182 Chronic viral hepatitis C: Secondary | ICD-10-CM | POA: Diagnosis not present

## 2017-03-06 DIAGNOSIS — B182 Chronic viral hepatitis C: Secondary | ICD-10-CM | POA: Diagnosis not present

## 2017-03-07 DIAGNOSIS — B182 Chronic viral hepatitis C: Secondary | ICD-10-CM | POA: Diagnosis not present

## 2017-03-08 DIAGNOSIS — B182 Chronic viral hepatitis C: Secondary | ICD-10-CM | POA: Diagnosis not present

## 2017-03-09 DIAGNOSIS — B182 Chronic viral hepatitis C: Secondary | ICD-10-CM | POA: Diagnosis not present

## 2017-03-10 DIAGNOSIS — B182 Chronic viral hepatitis C: Secondary | ICD-10-CM | POA: Diagnosis not present

## 2017-03-11 DIAGNOSIS — B182 Chronic viral hepatitis C: Secondary | ICD-10-CM | POA: Diagnosis not present

## 2017-03-12 DIAGNOSIS — B182 Chronic viral hepatitis C: Secondary | ICD-10-CM | POA: Diagnosis not present

## 2017-03-13 DIAGNOSIS — B182 Chronic viral hepatitis C: Secondary | ICD-10-CM | POA: Diagnosis not present

## 2017-03-14 DIAGNOSIS — B182 Chronic viral hepatitis C: Secondary | ICD-10-CM | POA: Diagnosis not present

## 2017-03-15 DIAGNOSIS — B182 Chronic viral hepatitis C: Secondary | ICD-10-CM | POA: Diagnosis not present

## 2017-03-16 DIAGNOSIS — B182 Chronic viral hepatitis C: Secondary | ICD-10-CM | POA: Diagnosis not present

## 2017-03-17 DIAGNOSIS — B182 Chronic viral hepatitis C: Secondary | ICD-10-CM | POA: Diagnosis not present

## 2017-03-18 DIAGNOSIS — B182 Chronic viral hepatitis C: Secondary | ICD-10-CM | POA: Diagnosis not present

## 2017-03-19 DIAGNOSIS — B182 Chronic viral hepatitis C: Secondary | ICD-10-CM | POA: Diagnosis not present

## 2017-03-20 DIAGNOSIS — B182 Chronic viral hepatitis C: Secondary | ICD-10-CM | POA: Diagnosis not present

## 2017-03-21 DIAGNOSIS — B182 Chronic viral hepatitis C: Secondary | ICD-10-CM | POA: Diagnosis not present

## 2017-03-22 DIAGNOSIS — B182 Chronic viral hepatitis C: Secondary | ICD-10-CM | POA: Diagnosis not present

## 2017-03-23 DIAGNOSIS — B182 Chronic viral hepatitis C: Secondary | ICD-10-CM | POA: Diagnosis not present

## 2017-03-24 DIAGNOSIS — B182 Chronic viral hepatitis C: Secondary | ICD-10-CM | POA: Diagnosis not present

## 2017-03-25 DIAGNOSIS — B182 Chronic viral hepatitis C: Secondary | ICD-10-CM | POA: Diagnosis not present

## 2017-03-26 DIAGNOSIS — B182 Chronic viral hepatitis C: Secondary | ICD-10-CM | POA: Diagnosis not present

## 2017-03-27 DIAGNOSIS — B182 Chronic viral hepatitis C: Secondary | ICD-10-CM | POA: Diagnosis not present

## 2017-03-28 DIAGNOSIS — B182 Chronic viral hepatitis C: Secondary | ICD-10-CM | POA: Diagnosis not present

## 2017-03-29 DIAGNOSIS — B182 Chronic viral hepatitis C: Secondary | ICD-10-CM | POA: Diagnosis not present

## 2017-03-30 DIAGNOSIS — B182 Chronic viral hepatitis C: Secondary | ICD-10-CM | POA: Diagnosis not present

## 2017-03-31 DIAGNOSIS — B182 Chronic viral hepatitis C: Secondary | ICD-10-CM | POA: Diagnosis not present

## 2017-04-01 DIAGNOSIS — B182 Chronic viral hepatitis C: Secondary | ICD-10-CM | POA: Diagnosis not present

## 2017-04-16 DIAGNOSIS — B182 Chronic viral hepatitis C: Secondary | ICD-10-CM | POA: Diagnosis not present

## 2017-04-17 DIAGNOSIS — B182 Chronic viral hepatitis C: Secondary | ICD-10-CM | POA: Diagnosis not present

## 2017-04-18 DIAGNOSIS — B182 Chronic viral hepatitis C: Secondary | ICD-10-CM | POA: Diagnosis not present

## 2017-04-20 DIAGNOSIS — B182 Chronic viral hepatitis C: Secondary | ICD-10-CM | POA: Diagnosis not present

## 2017-04-21 DIAGNOSIS — B182 Chronic viral hepatitis C: Secondary | ICD-10-CM | POA: Diagnosis not present

## 2017-04-22 DIAGNOSIS — B182 Chronic viral hepatitis C: Secondary | ICD-10-CM | POA: Diagnosis not present

## 2017-04-23 DIAGNOSIS — B182 Chronic viral hepatitis C: Secondary | ICD-10-CM | POA: Diagnosis not present

## 2017-04-24 DIAGNOSIS — B182 Chronic viral hepatitis C: Secondary | ICD-10-CM | POA: Diagnosis not present

## 2017-04-25 DIAGNOSIS — B182 Chronic viral hepatitis C: Secondary | ICD-10-CM | POA: Diagnosis not present

## 2017-04-26 DIAGNOSIS — B182 Chronic viral hepatitis C: Secondary | ICD-10-CM | POA: Diagnosis not present

## 2017-04-27 DIAGNOSIS — B182 Chronic viral hepatitis C: Secondary | ICD-10-CM | POA: Diagnosis not present

## 2017-04-28 DIAGNOSIS — B182 Chronic viral hepatitis C: Secondary | ICD-10-CM | POA: Diagnosis not present

## 2017-04-29 DIAGNOSIS — B182 Chronic viral hepatitis C: Secondary | ICD-10-CM | POA: Diagnosis not present

## 2017-04-30 DIAGNOSIS — B182 Chronic viral hepatitis C: Secondary | ICD-10-CM | POA: Diagnosis not present

## 2017-05-01 DIAGNOSIS — B182 Chronic viral hepatitis C: Secondary | ICD-10-CM | POA: Diagnosis not present

## 2017-05-02 DIAGNOSIS — B182 Chronic viral hepatitis C: Secondary | ICD-10-CM | POA: Diagnosis not present

## 2017-05-03 DIAGNOSIS — B182 Chronic viral hepatitis C: Secondary | ICD-10-CM | POA: Diagnosis not present

## 2017-05-04 DIAGNOSIS — B182 Chronic viral hepatitis C: Secondary | ICD-10-CM | POA: Diagnosis not present

## 2017-05-05 DIAGNOSIS — B182 Chronic viral hepatitis C: Secondary | ICD-10-CM | POA: Diagnosis not present

## 2017-05-06 DIAGNOSIS — B182 Chronic viral hepatitis C: Secondary | ICD-10-CM | POA: Diagnosis not present

## 2017-05-07 DIAGNOSIS — B182 Chronic viral hepatitis C: Secondary | ICD-10-CM | POA: Diagnosis not present

## 2017-05-08 DIAGNOSIS — B182 Chronic viral hepatitis C: Secondary | ICD-10-CM | POA: Diagnosis not present

## 2017-05-09 DIAGNOSIS — B182 Chronic viral hepatitis C: Secondary | ICD-10-CM | POA: Diagnosis not present

## 2017-05-10 DIAGNOSIS — B182 Chronic viral hepatitis C: Secondary | ICD-10-CM | POA: Diagnosis not present

## 2017-05-11 DIAGNOSIS — B182 Chronic viral hepatitis C: Secondary | ICD-10-CM | POA: Diagnosis not present

## 2017-05-12 DIAGNOSIS — B182 Chronic viral hepatitis C: Secondary | ICD-10-CM | POA: Diagnosis not present

## 2017-05-13 DIAGNOSIS — B182 Chronic viral hepatitis C: Secondary | ICD-10-CM | POA: Diagnosis not present

## 2017-05-14 DIAGNOSIS — B182 Chronic viral hepatitis C: Secondary | ICD-10-CM | POA: Diagnosis not present

## 2017-05-15 DIAGNOSIS — B182 Chronic viral hepatitis C: Secondary | ICD-10-CM | POA: Diagnosis not present

## 2017-05-16 DIAGNOSIS — B182 Chronic viral hepatitis C: Secondary | ICD-10-CM | POA: Diagnosis not present

## 2017-05-17 DIAGNOSIS — B182 Chronic viral hepatitis C: Secondary | ICD-10-CM | POA: Diagnosis not present

## 2017-05-18 DIAGNOSIS — B182 Chronic viral hepatitis C: Secondary | ICD-10-CM | POA: Diagnosis not present

## 2017-05-19 DIAGNOSIS — B182 Chronic viral hepatitis C: Secondary | ICD-10-CM | POA: Diagnosis not present

## 2017-05-20 DIAGNOSIS — B182 Chronic viral hepatitis C: Secondary | ICD-10-CM | POA: Diagnosis not present

## 2017-05-21 DIAGNOSIS — B182 Chronic viral hepatitis C: Secondary | ICD-10-CM | POA: Diagnosis not present

## 2017-05-23 DIAGNOSIS — B182 Chronic viral hepatitis C: Secondary | ICD-10-CM | POA: Diagnosis not present

## 2017-05-24 DIAGNOSIS — B182 Chronic viral hepatitis C: Secondary | ICD-10-CM | POA: Diagnosis not present

## 2017-05-25 DIAGNOSIS — B182 Chronic viral hepatitis C: Secondary | ICD-10-CM | POA: Diagnosis not present

## 2017-05-26 DIAGNOSIS — B182 Chronic viral hepatitis C: Secondary | ICD-10-CM | POA: Diagnosis not present

## 2017-05-27 DIAGNOSIS — B182 Chronic viral hepatitis C: Secondary | ICD-10-CM | POA: Diagnosis not present

## 2017-05-28 DIAGNOSIS — B182 Chronic viral hepatitis C: Secondary | ICD-10-CM | POA: Diagnosis not present

## 2017-05-29 DIAGNOSIS — B182 Chronic viral hepatitis C: Secondary | ICD-10-CM | POA: Diagnosis not present

## 2017-05-30 DIAGNOSIS — B182 Chronic viral hepatitis C: Secondary | ICD-10-CM | POA: Diagnosis not present

## 2017-05-31 DIAGNOSIS — B182 Chronic viral hepatitis C: Secondary | ICD-10-CM | POA: Diagnosis not present

## 2017-06-01 DIAGNOSIS — B182 Chronic viral hepatitis C: Secondary | ICD-10-CM | POA: Diagnosis not present

## 2017-06-02 DIAGNOSIS — B182 Chronic viral hepatitis C: Secondary | ICD-10-CM | POA: Diagnosis not present

## 2017-06-03 DIAGNOSIS — B182 Chronic viral hepatitis C: Secondary | ICD-10-CM | POA: Diagnosis not present

## 2017-06-04 DIAGNOSIS — B182 Chronic viral hepatitis C: Secondary | ICD-10-CM | POA: Diagnosis not present

## 2017-06-05 DIAGNOSIS — B182 Chronic viral hepatitis C: Secondary | ICD-10-CM | POA: Diagnosis not present

## 2017-06-06 DIAGNOSIS — B182 Chronic viral hepatitis C: Secondary | ICD-10-CM | POA: Diagnosis not present

## 2017-06-07 DIAGNOSIS — B182 Chronic viral hepatitis C: Secondary | ICD-10-CM | POA: Diagnosis not present

## 2017-06-08 DIAGNOSIS — B182 Chronic viral hepatitis C: Secondary | ICD-10-CM | POA: Diagnosis not present

## 2017-06-09 DIAGNOSIS — B182 Chronic viral hepatitis C: Secondary | ICD-10-CM | POA: Diagnosis not present

## 2017-06-10 DIAGNOSIS — B182 Chronic viral hepatitis C: Secondary | ICD-10-CM | POA: Diagnosis not present

## 2017-06-11 DIAGNOSIS — B182 Chronic viral hepatitis C: Secondary | ICD-10-CM | POA: Diagnosis not present

## 2017-06-12 DIAGNOSIS — B182 Chronic viral hepatitis C: Secondary | ICD-10-CM | POA: Diagnosis not present

## 2017-06-13 DIAGNOSIS — B182 Chronic viral hepatitis C: Secondary | ICD-10-CM | POA: Diagnosis not present

## 2017-06-14 DIAGNOSIS — B182 Chronic viral hepatitis C: Secondary | ICD-10-CM | POA: Diagnosis not present

## 2017-06-15 DIAGNOSIS — B182 Chronic viral hepatitis C: Secondary | ICD-10-CM | POA: Diagnosis not present

## 2017-06-16 DIAGNOSIS — B182 Chronic viral hepatitis C: Secondary | ICD-10-CM | POA: Diagnosis not present

## 2017-06-17 DIAGNOSIS — B182 Chronic viral hepatitis C: Secondary | ICD-10-CM | POA: Diagnosis not present

## 2017-06-18 DIAGNOSIS — B182 Chronic viral hepatitis C: Secondary | ICD-10-CM | POA: Diagnosis not present

## 2017-06-19 DIAGNOSIS — B182 Chronic viral hepatitis C: Secondary | ICD-10-CM | POA: Diagnosis not present

## 2017-06-20 DIAGNOSIS — B182 Chronic viral hepatitis C: Secondary | ICD-10-CM | POA: Diagnosis not present

## 2017-06-21 DIAGNOSIS — B182 Chronic viral hepatitis C: Secondary | ICD-10-CM | POA: Diagnosis not present

## 2017-06-22 DIAGNOSIS — B182 Chronic viral hepatitis C: Secondary | ICD-10-CM | POA: Diagnosis not present

## 2017-06-23 DIAGNOSIS — B182 Chronic viral hepatitis C: Secondary | ICD-10-CM | POA: Diagnosis not present

## 2017-06-24 DIAGNOSIS — B182 Chronic viral hepatitis C: Secondary | ICD-10-CM | POA: Diagnosis not present

## 2017-06-25 DIAGNOSIS — B182 Chronic viral hepatitis C: Secondary | ICD-10-CM | POA: Diagnosis not present

## 2017-06-26 DIAGNOSIS — B182 Chronic viral hepatitis C: Secondary | ICD-10-CM | POA: Diagnosis not present

## 2017-06-27 DIAGNOSIS — B182 Chronic viral hepatitis C: Secondary | ICD-10-CM | POA: Diagnosis not present

## 2017-06-28 DIAGNOSIS — B182 Chronic viral hepatitis C: Secondary | ICD-10-CM | POA: Diagnosis not present

## 2017-06-29 DIAGNOSIS — B182 Chronic viral hepatitis C: Secondary | ICD-10-CM | POA: Diagnosis not present

## 2017-06-30 DIAGNOSIS — B182 Chronic viral hepatitis C: Secondary | ICD-10-CM | POA: Diagnosis not present

## 2017-07-01 DIAGNOSIS — B182 Chronic viral hepatitis C: Secondary | ICD-10-CM | POA: Diagnosis not present

## 2017-07-02 DIAGNOSIS — B182 Chronic viral hepatitis C: Secondary | ICD-10-CM | POA: Diagnosis not present

## 2017-07-03 DIAGNOSIS — B182 Chronic viral hepatitis C: Secondary | ICD-10-CM | POA: Diagnosis not present

## 2017-07-04 DIAGNOSIS — B182 Chronic viral hepatitis C: Secondary | ICD-10-CM | POA: Diagnosis not present

## 2017-07-05 DIAGNOSIS — B182 Chronic viral hepatitis C: Secondary | ICD-10-CM | POA: Diagnosis not present

## 2017-07-06 DIAGNOSIS — B182 Chronic viral hepatitis C: Secondary | ICD-10-CM | POA: Diagnosis not present

## 2017-07-07 DIAGNOSIS — B182 Chronic viral hepatitis C: Secondary | ICD-10-CM | POA: Diagnosis not present

## 2017-07-08 DIAGNOSIS — B182 Chronic viral hepatitis C: Secondary | ICD-10-CM | POA: Diagnosis not present

## 2017-07-09 DIAGNOSIS — B182 Chronic viral hepatitis C: Secondary | ICD-10-CM | POA: Diagnosis not present

## 2017-07-10 DIAGNOSIS — B182 Chronic viral hepatitis C: Secondary | ICD-10-CM | POA: Diagnosis not present

## 2017-07-11 DIAGNOSIS — B182 Chronic viral hepatitis C: Secondary | ICD-10-CM | POA: Diagnosis not present

## 2017-07-12 DIAGNOSIS — B182 Chronic viral hepatitis C: Secondary | ICD-10-CM | POA: Diagnosis not present

## 2017-07-13 DIAGNOSIS — B182 Chronic viral hepatitis C: Secondary | ICD-10-CM | POA: Diagnosis not present

## 2017-07-14 DIAGNOSIS — B182 Chronic viral hepatitis C: Secondary | ICD-10-CM | POA: Diagnosis not present

## 2017-07-15 DIAGNOSIS — B182 Chronic viral hepatitis C: Secondary | ICD-10-CM | POA: Diagnosis not present

## 2017-07-16 DIAGNOSIS — B182 Chronic viral hepatitis C: Secondary | ICD-10-CM | POA: Diagnosis not present

## 2017-07-17 DIAGNOSIS — B182 Chronic viral hepatitis C: Secondary | ICD-10-CM | POA: Diagnosis not present

## 2017-07-18 DIAGNOSIS — B182 Chronic viral hepatitis C: Secondary | ICD-10-CM | POA: Diagnosis not present

## 2017-07-19 DIAGNOSIS — B182 Chronic viral hepatitis C: Secondary | ICD-10-CM | POA: Diagnosis not present

## 2017-07-20 DIAGNOSIS — B182 Chronic viral hepatitis C: Secondary | ICD-10-CM | POA: Diagnosis not present

## 2017-07-21 DIAGNOSIS — B182 Chronic viral hepatitis C: Secondary | ICD-10-CM | POA: Diagnosis not present

## 2017-07-22 DIAGNOSIS — B182 Chronic viral hepatitis C: Secondary | ICD-10-CM | POA: Diagnosis not present

## 2017-07-23 DIAGNOSIS — B182 Chronic viral hepatitis C: Secondary | ICD-10-CM | POA: Diagnosis not present

## 2017-07-24 DIAGNOSIS — B182 Chronic viral hepatitis C: Secondary | ICD-10-CM | POA: Diagnosis not present

## 2017-07-25 DIAGNOSIS — B182 Chronic viral hepatitis C: Secondary | ICD-10-CM | POA: Diagnosis not present

## 2017-07-26 DIAGNOSIS — B182 Chronic viral hepatitis C: Secondary | ICD-10-CM | POA: Diagnosis not present

## 2017-07-27 DIAGNOSIS — B182 Chronic viral hepatitis C: Secondary | ICD-10-CM | POA: Diagnosis not present

## 2017-07-28 DIAGNOSIS — B182 Chronic viral hepatitis C: Secondary | ICD-10-CM | POA: Diagnosis not present

## 2017-07-29 DIAGNOSIS — B182 Chronic viral hepatitis C: Secondary | ICD-10-CM | POA: Diagnosis not present

## 2017-07-30 DIAGNOSIS — B182 Chronic viral hepatitis C: Secondary | ICD-10-CM | POA: Diagnosis not present

## 2017-07-31 DIAGNOSIS — B182 Chronic viral hepatitis C: Secondary | ICD-10-CM | POA: Diagnosis not present

## 2017-08-01 DIAGNOSIS — B182 Chronic viral hepatitis C: Secondary | ICD-10-CM | POA: Diagnosis not present

## 2017-08-02 DIAGNOSIS — B182 Chronic viral hepatitis C: Secondary | ICD-10-CM | POA: Diagnosis not present

## 2017-08-03 DIAGNOSIS — B182 Chronic viral hepatitis C: Secondary | ICD-10-CM | POA: Diagnosis not present

## 2017-08-04 DIAGNOSIS — B182 Chronic viral hepatitis C: Secondary | ICD-10-CM | POA: Diagnosis not present

## 2017-08-05 DIAGNOSIS — B182 Chronic viral hepatitis C: Secondary | ICD-10-CM | POA: Diagnosis not present

## 2017-08-06 DIAGNOSIS — B182 Chronic viral hepatitis C: Secondary | ICD-10-CM | POA: Diagnosis not present

## 2017-08-07 DIAGNOSIS — B182 Chronic viral hepatitis C: Secondary | ICD-10-CM | POA: Diagnosis not present

## 2017-08-08 DIAGNOSIS — B182 Chronic viral hepatitis C: Secondary | ICD-10-CM | POA: Diagnosis not present

## 2017-08-09 DIAGNOSIS — B182 Chronic viral hepatitis C: Secondary | ICD-10-CM | POA: Diagnosis not present

## 2017-08-10 DIAGNOSIS — B182 Chronic viral hepatitis C: Secondary | ICD-10-CM | POA: Diagnosis not present

## 2017-08-11 DIAGNOSIS — B182 Chronic viral hepatitis C: Secondary | ICD-10-CM | POA: Diagnosis not present

## 2017-08-12 DIAGNOSIS — B182 Chronic viral hepatitis C: Secondary | ICD-10-CM | POA: Diagnosis not present

## 2017-08-13 DIAGNOSIS — B182 Chronic viral hepatitis C: Secondary | ICD-10-CM | POA: Diagnosis not present

## 2017-08-14 DIAGNOSIS — B182 Chronic viral hepatitis C: Secondary | ICD-10-CM | POA: Diagnosis not present

## 2017-08-15 DIAGNOSIS — B182 Chronic viral hepatitis C: Secondary | ICD-10-CM | POA: Diagnosis not present

## 2017-08-16 DIAGNOSIS — B182 Chronic viral hepatitis C: Secondary | ICD-10-CM | POA: Diagnosis not present

## 2017-08-17 DIAGNOSIS — B182 Chronic viral hepatitis C: Secondary | ICD-10-CM | POA: Diagnosis not present

## 2017-08-18 DIAGNOSIS — B182 Chronic viral hepatitis C: Secondary | ICD-10-CM | POA: Diagnosis not present

## 2017-08-19 DIAGNOSIS — B182 Chronic viral hepatitis C: Secondary | ICD-10-CM | POA: Diagnosis not present

## 2017-08-20 DIAGNOSIS — B182 Chronic viral hepatitis C: Secondary | ICD-10-CM | POA: Diagnosis not present

## 2017-08-21 DIAGNOSIS — B182 Chronic viral hepatitis C: Secondary | ICD-10-CM | POA: Diagnosis not present

## 2017-08-22 DIAGNOSIS — B182 Chronic viral hepatitis C: Secondary | ICD-10-CM | POA: Diagnosis not present

## 2017-08-23 DIAGNOSIS — B182 Chronic viral hepatitis C: Secondary | ICD-10-CM | POA: Diagnosis not present

## 2017-08-24 DIAGNOSIS — B182 Chronic viral hepatitis C: Secondary | ICD-10-CM | POA: Diagnosis not present

## 2017-08-25 DIAGNOSIS — B182 Chronic viral hepatitis C: Secondary | ICD-10-CM | POA: Diagnosis not present

## 2017-08-26 DIAGNOSIS — B182 Chronic viral hepatitis C: Secondary | ICD-10-CM | POA: Diagnosis not present

## 2017-08-27 DIAGNOSIS — B182 Chronic viral hepatitis C: Secondary | ICD-10-CM | POA: Diagnosis not present

## 2017-08-28 DIAGNOSIS — B182 Chronic viral hepatitis C: Secondary | ICD-10-CM | POA: Diagnosis not present

## 2017-08-29 DIAGNOSIS — B182 Chronic viral hepatitis C: Secondary | ICD-10-CM | POA: Diagnosis not present

## 2017-08-30 DIAGNOSIS — B182 Chronic viral hepatitis C: Secondary | ICD-10-CM | POA: Diagnosis not present

## 2017-08-31 DIAGNOSIS — B182 Chronic viral hepatitis C: Secondary | ICD-10-CM | POA: Diagnosis not present

## 2017-09-01 DIAGNOSIS — B182 Chronic viral hepatitis C: Secondary | ICD-10-CM | POA: Diagnosis not present

## 2017-09-02 DIAGNOSIS — B182 Chronic viral hepatitis C: Secondary | ICD-10-CM | POA: Diagnosis not present

## 2017-09-03 DIAGNOSIS — B182 Chronic viral hepatitis C: Secondary | ICD-10-CM | POA: Diagnosis not present

## 2017-09-04 DIAGNOSIS — B182 Chronic viral hepatitis C: Secondary | ICD-10-CM | POA: Diagnosis not present

## 2017-09-05 DIAGNOSIS — B182 Chronic viral hepatitis C: Secondary | ICD-10-CM | POA: Diagnosis not present

## 2017-09-05 DIAGNOSIS — F1020 Alcohol dependence, uncomplicated: Secondary | ICD-10-CM | POA: Diagnosis not present

## 2017-09-06 DIAGNOSIS — B182 Chronic viral hepatitis C: Secondary | ICD-10-CM | POA: Diagnosis not present

## 2017-09-07 DIAGNOSIS — B182 Chronic viral hepatitis C: Secondary | ICD-10-CM | POA: Diagnosis not present

## 2017-09-08 DIAGNOSIS — B182 Chronic viral hepatitis C: Secondary | ICD-10-CM | POA: Diagnosis not present

## 2017-09-09 DIAGNOSIS — B182 Chronic viral hepatitis C: Secondary | ICD-10-CM | POA: Diagnosis not present

## 2017-09-10 DIAGNOSIS — B182 Chronic viral hepatitis C: Secondary | ICD-10-CM | POA: Diagnosis not present

## 2017-09-11 DIAGNOSIS — B182 Chronic viral hepatitis C: Secondary | ICD-10-CM | POA: Diagnosis not present

## 2017-09-12 DIAGNOSIS — B182 Chronic viral hepatitis C: Secondary | ICD-10-CM | POA: Diagnosis not present

## 2017-09-13 DIAGNOSIS — B182 Chronic viral hepatitis C: Secondary | ICD-10-CM | POA: Diagnosis not present

## 2017-09-14 DIAGNOSIS — B182 Chronic viral hepatitis C: Secondary | ICD-10-CM | POA: Diagnosis not present

## 2017-09-15 DIAGNOSIS — B182 Chronic viral hepatitis C: Secondary | ICD-10-CM | POA: Diagnosis not present

## 2017-09-16 DIAGNOSIS — B182 Chronic viral hepatitis C: Secondary | ICD-10-CM | POA: Diagnosis not present

## 2017-09-17 DIAGNOSIS — B182 Chronic viral hepatitis C: Secondary | ICD-10-CM | POA: Diagnosis not present

## 2017-09-18 DIAGNOSIS — B182 Chronic viral hepatitis C: Secondary | ICD-10-CM | POA: Diagnosis not present

## 2017-09-19 DIAGNOSIS — B182 Chronic viral hepatitis C: Secondary | ICD-10-CM | POA: Diagnosis not present

## 2017-09-20 DIAGNOSIS — B182 Chronic viral hepatitis C: Secondary | ICD-10-CM | POA: Diagnosis not present

## 2017-09-21 DIAGNOSIS — B182 Chronic viral hepatitis C: Secondary | ICD-10-CM | POA: Diagnosis not present

## 2017-09-22 DIAGNOSIS — B182 Chronic viral hepatitis C: Secondary | ICD-10-CM | POA: Diagnosis not present

## 2017-09-23 DIAGNOSIS — B182 Chronic viral hepatitis C: Secondary | ICD-10-CM | POA: Diagnosis not present

## 2017-09-24 DIAGNOSIS — B182 Chronic viral hepatitis C: Secondary | ICD-10-CM | POA: Diagnosis not present

## 2017-09-25 DIAGNOSIS — B182 Chronic viral hepatitis C: Secondary | ICD-10-CM | POA: Diagnosis not present

## 2017-09-26 DIAGNOSIS — B182 Chronic viral hepatitis C: Secondary | ICD-10-CM | POA: Diagnosis not present

## 2017-09-27 DIAGNOSIS — B182 Chronic viral hepatitis C: Secondary | ICD-10-CM | POA: Diagnosis not present

## 2017-09-28 DIAGNOSIS — B182 Chronic viral hepatitis C: Secondary | ICD-10-CM | POA: Diagnosis not present

## 2017-09-29 DIAGNOSIS — B182 Chronic viral hepatitis C: Secondary | ICD-10-CM | POA: Diagnosis not present

## 2017-09-30 DIAGNOSIS — B182 Chronic viral hepatitis C: Secondary | ICD-10-CM | POA: Diagnosis not present

## 2017-10-01 DIAGNOSIS — B182 Chronic viral hepatitis C: Secondary | ICD-10-CM | POA: Diagnosis not present

## 2017-10-02 DIAGNOSIS — B182 Chronic viral hepatitis C: Secondary | ICD-10-CM | POA: Diagnosis not present

## 2017-10-03 DIAGNOSIS — B182 Chronic viral hepatitis C: Secondary | ICD-10-CM | POA: Diagnosis not present

## 2017-10-04 DIAGNOSIS — B182 Chronic viral hepatitis C: Secondary | ICD-10-CM | POA: Diagnosis not present

## 2017-10-05 DIAGNOSIS — B182 Chronic viral hepatitis C: Secondary | ICD-10-CM | POA: Diagnosis not present

## 2017-10-06 DIAGNOSIS — B182 Chronic viral hepatitis C: Secondary | ICD-10-CM | POA: Diagnosis not present

## 2017-10-07 DIAGNOSIS — B182 Chronic viral hepatitis C: Secondary | ICD-10-CM | POA: Diagnosis not present

## 2017-10-08 DIAGNOSIS — B182 Chronic viral hepatitis C: Secondary | ICD-10-CM | POA: Diagnosis not present

## 2017-10-09 DIAGNOSIS — B182 Chronic viral hepatitis C: Secondary | ICD-10-CM | POA: Diagnosis not present

## 2017-10-10 DIAGNOSIS — B182 Chronic viral hepatitis C: Secondary | ICD-10-CM | POA: Diagnosis not present

## 2017-10-11 DIAGNOSIS — B182 Chronic viral hepatitis C: Secondary | ICD-10-CM | POA: Diagnosis not present

## 2017-10-12 DIAGNOSIS — B182 Chronic viral hepatitis C: Secondary | ICD-10-CM | POA: Diagnosis not present

## 2017-10-13 DIAGNOSIS — B182 Chronic viral hepatitis C: Secondary | ICD-10-CM | POA: Diagnosis not present

## 2017-10-14 DIAGNOSIS — B182 Chronic viral hepatitis C: Secondary | ICD-10-CM | POA: Diagnosis not present

## 2017-10-15 DIAGNOSIS — B182 Chronic viral hepatitis C: Secondary | ICD-10-CM | POA: Diagnosis not present

## 2017-10-16 DIAGNOSIS — B182 Chronic viral hepatitis C: Secondary | ICD-10-CM | POA: Diagnosis not present

## 2017-10-17 DIAGNOSIS — B182 Chronic viral hepatitis C: Secondary | ICD-10-CM | POA: Diagnosis not present

## 2017-10-18 DIAGNOSIS — B182 Chronic viral hepatitis C: Secondary | ICD-10-CM | POA: Diagnosis not present

## 2017-10-19 DIAGNOSIS — B182 Chronic viral hepatitis C: Secondary | ICD-10-CM | POA: Diagnosis not present

## 2017-10-20 DIAGNOSIS — B182 Chronic viral hepatitis C: Secondary | ICD-10-CM | POA: Diagnosis not present

## 2017-10-21 DIAGNOSIS — B182 Chronic viral hepatitis C: Secondary | ICD-10-CM | POA: Diagnosis not present

## 2017-10-22 DIAGNOSIS — B182 Chronic viral hepatitis C: Secondary | ICD-10-CM | POA: Diagnosis not present

## 2017-10-23 DIAGNOSIS — B182 Chronic viral hepatitis C: Secondary | ICD-10-CM | POA: Diagnosis not present

## 2017-10-24 DIAGNOSIS — B182 Chronic viral hepatitis C: Secondary | ICD-10-CM | POA: Diagnosis not present

## 2017-10-25 DIAGNOSIS — B182 Chronic viral hepatitis C: Secondary | ICD-10-CM | POA: Diagnosis not present

## 2017-10-26 DIAGNOSIS — B182 Chronic viral hepatitis C: Secondary | ICD-10-CM | POA: Diagnosis not present

## 2017-10-27 DIAGNOSIS — B182 Chronic viral hepatitis C: Secondary | ICD-10-CM | POA: Diagnosis not present

## 2017-10-28 DIAGNOSIS — B182 Chronic viral hepatitis C: Secondary | ICD-10-CM | POA: Diagnosis not present

## 2017-10-29 DIAGNOSIS — B182 Chronic viral hepatitis C: Secondary | ICD-10-CM | POA: Diagnosis not present

## 2017-10-30 DIAGNOSIS — B182 Chronic viral hepatitis C: Secondary | ICD-10-CM | POA: Diagnosis not present

## 2017-10-31 DIAGNOSIS — B182 Chronic viral hepatitis C: Secondary | ICD-10-CM | POA: Diagnosis not present

## 2017-11-01 DIAGNOSIS — B182 Chronic viral hepatitis C: Secondary | ICD-10-CM | POA: Diagnosis not present

## 2017-11-02 DIAGNOSIS — B182 Chronic viral hepatitis C: Secondary | ICD-10-CM | POA: Diagnosis not present

## 2017-11-03 DIAGNOSIS — B182 Chronic viral hepatitis C: Secondary | ICD-10-CM | POA: Diagnosis not present

## 2017-11-04 DIAGNOSIS — B182 Chronic viral hepatitis C: Secondary | ICD-10-CM | POA: Diagnosis not present

## 2017-11-05 DIAGNOSIS — B182 Chronic viral hepatitis C: Secondary | ICD-10-CM | POA: Diagnosis not present

## 2017-11-06 DIAGNOSIS — B182 Chronic viral hepatitis C: Secondary | ICD-10-CM | POA: Diagnosis not present

## 2017-11-07 DIAGNOSIS — B182 Chronic viral hepatitis C: Secondary | ICD-10-CM | POA: Diagnosis not present

## 2017-11-08 DIAGNOSIS — B182 Chronic viral hepatitis C: Secondary | ICD-10-CM | POA: Diagnosis not present

## 2017-11-09 DIAGNOSIS — B182 Chronic viral hepatitis C: Secondary | ICD-10-CM | POA: Diagnosis not present

## 2017-11-10 DIAGNOSIS — B182 Chronic viral hepatitis C: Secondary | ICD-10-CM | POA: Diagnosis not present

## 2017-11-11 DIAGNOSIS — B182 Chronic viral hepatitis C: Secondary | ICD-10-CM | POA: Diagnosis not present

## 2017-11-12 DIAGNOSIS — B182 Chronic viral hepatitis C: Secondary | ICD-10-CM | POA: Diagnosis not present

## 2017-11-13 DIAGNOSIS — B182 Chronic viral hepatitis C: Secondary | ICD-10-CM | POA: Diagnosis not present

## 2017-11-14 DIAGNOSIS — B182 Chronic viral hepatitis C: Secondary | ICD-10-CM | POA: Diagnosis not present

## 2017-11-15 DIAGNOSIS — B182 Chronic viral hepatitis C: Secondary | ICD-10-CM | POA: Diagnosis not present

## 2017-11-16 DIAGNOSIS — B182 Chronic viral hepatitis C: Secondary | ICD-10-CM | POA: Diagnosis not present

## 2017-11-17 DIAGNOSIS — B182 Chronic viral hepatitis C: Secondary | ICD-10-CM | POA: Diagnosis not present

## 2017-11-18 DIAGNOSIS — B182 Chronic viral hepatitis C: Secondary | ICD-10-CM | POA: Diagnosis not present

## 2017-11-19 DIAGNOSIS — B182 Chronic viral hepatitis C: Secondary | ICD-10-CM | POA: Diagnosis not present

## 2017-11-20 DIAGNOSIS — B182 Chronic viral hepatitis C: Secondary | ICD-10-CM | POA: Diagnosis not present

## 2017-11-21 DIAGNOSIS — B182 Chronic viral hepatitis C: Secondary | ICD-10-CM | POA: Diagnosis not present

## 2017-11-22 DIAGNOSIS — B182 Chronic viral hepatitis C: Secondary | ICD-10-CM | POA: Diagnosis not present

## 2017-11-23 DIAGNOSIS — B182 Chronic viral hepatitis C: Secondary | ICD-10-CM | POA: Diagnosis not present

## 2017-11-24 DIAGNOSIS — B182 Chronic viral hepatitis C: Secondary | ICD-10-CM | POA: Diagnosis not present

## 2017-11-25 DIAGNOSIS — B182 Chronic viral hepatitis C: Secondary | ICD-10-CM | POA: Diagnosis not present

## 2017-11-26 DIAGNOSIS — B182 Chronic viral hepatitis C: Secondary | ICD-10-CM | POA: Diagnosis not present

## 2017-11-27 DIAGNOSIS — B182 Chronic viral hepatitis C: Secondary | ICD-10-CM | POA: Diagnosis not present

## 2017-11-28 DIAGNOSIS — B182 Chronic viral hepatitis C: Secondary | ICD-10-CM | POA: Diagnosis not present

## 2017-11-30 DIAGNOSIS — B182 Chronic viral hepatitis C: Secondary | ICD-10-CM | POA: Diagnosis not present

## 2017-12-01 DIAGNOSIS — B182 Chronic viral hepatitis C: Secondary | ICD-10-CM | POA: Diagnosis not present

## 2017-12-02 DIAGNOSIS — B182 Chronic viral hepatitis C: Secondary | ICD-10-CM | POA: Diagnosis not present

## 2017-12-03 DIAGNOSIS — B182 Chronic viral hepatitis C: Secondary | ICD-10-CM | POA: Diagnosis not present

## 2017-12-04 DIAGNOSIS — B182 Chronic viral hepatitis C: Secondary | ICD-10-CM | POA: Diagnosis not present

## 2017-12-05 DIAGNOSIS — B182 Chronic viral hepatitis C: Secondary | ICD-10-CM | POA: Diagnosis not present

## 2017-12-06 DIAGNOSIS — B182 Chronic viral hepatitis C: Secondary | ICD-10-CM | POA: Diagnosis not present

## 2017-12-07 DIAGNOSIS — B182 Chronic viral hepatitis C: Secondary | ICD-10-CM | POA: Diagnosis not present

## 2017-12-08 DIAGNOSIS — B182 Chronic viral hepatitis C: Secondary | ICD-10-CM | POA: Diagnosis not present

## 2017-12-09 DIAGNOSIS — B182 Chronic viral hepatitis C: Secondary | ICD-10-CM | POA: Diagnosis not present

## 2017-12-10 DIAGNOSIS — B182 Chronic viral hepatitis C: Secondary | ICD-10-CM | POA: Diagnosis not present

## 2017-12-11 DIAGNOSIS — B182 Chronic viral hepatitis C: Secondary | ICD-10-CM | POA: Diagnosis not present

## 2017-12-12 DIAGNOSIS — B182 Chronic viral hepatitis C: Secondary | ICD-10-CM | POA: Diagnosis not present

## 2017-12-13 DIAGNOSIS — B182 Chronic viral hepatitis C: Secondary | ICD-10-CM | POA: Diagnosis not present

## 2017-12-14 DIAGNOSIS — B182 Chronic viral hepatitis C: Secondary | ICD-10-CM | POA: Diagnosis not present

## 2017-12-15 DIAGNOSIS — B182 Chronic viral hepatitis C: Secondary | ICD-10-CM | POA: Diagnosis not present

## 2017-12-16 DIAGNOSIS — B182 Chronic viral hepatitis C: Secondary | ICD-10-CM | POA: Diagnosis not present

## 2017-12-17 DIAGNOSIS — B182 Chronic viral hepatitis C: Secondary | ICD-10-CM | POA: Diagnosis not present

## 2017-12-18 DIAGNOSIS — B182 Chronic viral hepatitis C: Secondary | ICD-10-CM | POA: Diagnosis not present

## 2017-12-19 DIAGNOSIS — B182 Chronic viral hepatitis C: Secondary | ICD-10-CM | POA: Diagnosis not present

## 2017-12-20 DIAGNOSIS — B182 Chronic viral hepatitis C: Secondary | ICD-10-CM | POA: Diagnosis not present

## 2017-12-21 DIAGNOSIS — B182 Chronic viral hepatitis C: Secondary | ICD-10-CM | POA: Diagnosis not present

## 2017-12-22 DIAGNOSIS — B182 Chronic viral hepatitis C: Secondary | ICD-10-CM | POA: Diagnosis not present

## 2017-12-23 DIAGNOSIS — B182 Chronic viral hepatitis C: Secondary | ICD-10-CM | POA: Diagnosis not present

## 2017-12-24 DIAGNOSIS — B182 Chronic viral hepatitis C: Secondary | ICD-10-CM | POA: Diagnosis not present

## 2017-12-25 DIAGNOSIS — B182 Chronic viral hepatitis C: Secondary | ICD-10-CM | POA: Diagnosis not present

## 2017-12-26 DIAGNOSIS — B182 Chronic viral hepatitis C: Secondary | ICD-10-CM | POA: Diagnosis not present

## 2017-12-27 DIAGNOSIS — B182 Chronic viral hepatitis C: Secondary | ICD-10-CM | POA: Diagnosis not present

## 2017-12-28 DIAGNOSIS — B182 Chronic viral hepatitis C: Secondary | ICD-10-CM | POA: Diagnosis not present

## 2017-12-29 DIAGNOSIS — B182 Chronic viral hepatitis C: Secondary | ICD-10-CM | POA: Diagnosis not present

## 2017-12-30 DIAGNOSIS — B182 Chronic viral hepatitis C: Secondary | ICD-10-CM | POA: Diagnosis not present

## 2017-12-31 DIAGNOSIS — B182 Chronic viral hepatitis C: Secondary | ICD-10-CM | POA: Diagnosis not present

## 2018-01-01 DIAGNOSIS — B182 Chronic viral hepatitis C: Secondary | ICD-10-CM | POA: Diagnosis not present

## 2018-01-02 DIAGNOSIS — B182 Chronic viral hepatitis C: Secondary | ICD-10-CM | POA: Diagnosis not present

## 2018-01-03 DIAGNOSIS — B182 Chronic viral hepatitis C: Secondary | ICD-10-CM | POA: Diagnosis not present

## 2018-01-04 DIAGNOSIS — B182 Chronic viral hepatitis C: Secondary | ICD-10-CM | POA: Diagnosis not present

## 2018-01-05 DIAGNOSIS — B182 Chronic viral hepatitis C: Secondary | ICD-10-CM | POA: Diagnosis not present

## 2018-01-06 DIAGNOSIS — B182 Chronic viral hepatitis C: Secondary | ICD-10-CM | POA: Diagnosis not present

## 2018-01-07 DIAGNOSIS — B182 Chronic viral hepatitis C: Secondary | ICD-10-CM | POA: Diagnosis not present

## 2018-01-08 DIAGNOSIS — B182 Chronic viral hepatitis C: Secondary | ICD-10-CM | POA: Diagnosis not present

## 2018-01-09 DIAGNOSIS — B182 Chronic viral hepatitis C: Secondary | ICD-10-CM | POA: Diagnosis not present

## 2018-01-10 DIAGNOSIS — B182 Chronic viral hepatitis C: Secondary | ICD-10-CM | POA: Diagnosis not present

## 2018-01-11 DIAGNOSIS — B182 Chronic viral hepatitis C: Secondary | ICD-10-CM | POA: Diagnosis not present

## 2018-01-12 DIAGNOSIS — B182 Chronic viral hepatitis C: Secondary | ICD-10-CM | POA: Diagnosis not present

## 2018-01-13 DIAGNOSIS — B182 Chronic viral hepatitis C: Secondary | ICD-10-CM | POA: Diagnosis not present

## 2018-01-14 DIAGNOSIS — B182 Chronic viral hepatitis C: Secondary | ICD-10-CM | POA: Diagnosis not present

## 2018-01-15 DIAGNOSIS — B182 Chronic viral hepatitis C: Secondary | ICD-10-CM | POA: Diagnosis not present

## 2018-01-16 DIAGNOSIS — B182 Chronic viral hepatitis C: Secondary | ICD-10-CM | POA: Diagnosis not present

## 2018-01-17 DIAGNOSIS — B182 Chronic viral hepatitis C: Secondary | ICD-10-CM | POA: Diagnosis not present

## 2018-01-18 DIAGNOSIS — B182 Chronic viral hepatitis C: Secondary | ICD-10-CM | POA: Diagnosis not present

## 2018-01-19 DIAGNOSIS — B182 Chronic viral hepatitis C: Secondary | ICD-10-CM | POA: Diagnosis not present

## 2018-01-20 DIAGNOSIS — B182 Chronic viral hepatitis C: Secondary | ICD-10-CM | POA: Diagnosis not present

## 2018-01-21 DIAGNOSIS — B182 Chronic viral hepatitis C: Secondary | ICD-10-CM | POA: Diagnosis not present

## 2018-01-22 DIAGNOSIS — B182 Chronic viral hepatitis C: Secondary | ICD-10-CM | POA: Diagnosis not present

## 2018-01-23 DIAGNOSIS — B182 Chronic viral hepatitis C: Secondary | ICD-10-CM | POA: Diagnosis not present

## 2018-01-24 DIAGNOSIS — B182 Chronic viral hepatitis C: Secondary | ICD-10-CM | POA: Diagnosis not present

## 2018-01-25 DIAGNOSIS — B182 Chronic viral hepatitis C: Secondary | ICD-10-CM | POA: Diagnosis not present

## 2018-01-26 DIAGNOSIS — B182 Chronic viral hepatitis C: Secondary | ICD-10-CM | POA: Diagnosis not present

## 2018-01-27 DIAGNOSIS — B182 Chronic viral hepatitis C: Secondary | ICD-10-CM | POA: Diagnosis not present

## 2018-01-28 DIAGNOSIS — B182 Chronic viral hepatitis C: Secondary | ICD-10-CM | POA: Diagnosis not present

## 2018-01-29 DIAGNOSIS — B182 Chronic viral hepatitis C: Secondary | ICD-10-CM | POA: Diagnosis not present

## 2018-01-30 DIAGNOSIS — B182 Chronic viral hepatitis C: Secondary | ICD-10-CM | POA: Diagnosis not present

## 2018-01-31 DIAGNOSIS — B182 Chronic viral hepatitis C: Secondary | ICD-10-CM | POA: Diagnosis not present

## 2018-02-01 DIAGNOSIS — B182 Chronic viral hepatitis C: Secondary | ICD-10-CM | POA: Diagnosis not present

## 2018-02-02 DIAGNOSIS — B182 Chronic viral hepatitis C: Secondary | ICD-10-CM | POA: Diagnosis not present

## 2018-02-03 DIAGNOSIS — B182 Chronic viral hepatitis C: Secondary | ICD-10-CM | POA: Diagnosis not present

## 2018-02-04 DIAGNOSIS — B182 Chronic viral hepatitis C: Secondary | ICD-10-CM | POA: Diagnosis not present

## 2018-02-05 DIAGNOSIS — B182 Chronic viral hepatitis C: Secondary | ICD-10-CM | POA: Diagnosis not present

## 2018-02-06 DIAGNOSIS — B182 Chronic viral hepatitis C: Secondary | ICD-10-CM | POA: Diagnosis not present

## 2018-02-07 DIAGNOSIS — B182 Chronic viral hepatitis C: Secondary | ICD-10-CM | POA: Diagnosis not present

## 2018-02-08 DIAGNOSIS — B182 Chronic viral hepatitis C: Secondary | ICD-10-CM | POA: Diagnosis not present

## 2018-02-09 DIAGNOSIS — B182 Chronic viral hepatitis C: Secondary | ICD-10-CM | POA: Diagnosis not present

## 2018-02-10 DIAGNOSIS — B182 Chronic viral hepatitis C: Secondary | ICD-10-CM | POA: Diagnosis not present

## 2018-02-11 DIAGNOSIS — B182 Chronic viral hepatitis C: Secondary | ICD-10-CM | POA: Diagnosis not present

## 2018-02-12 DIAGNOSIS — B182 Chronic viral hepatitis C: Secondary | ICD-10-CM | POA: Diagnosis not present

## 2018-02-13 DIAGNOSIS — B182 Chronic viral hepatitis C: Secondary | ICD-10-CM | POA: Diagnosis not present

## 2018-02-14 DIAGNOSIS — B182 Chronic viral hepatitis C: Secondary | ICD-10-CM | POA: Diagnosis not present

## 2018-02-15 DIAGNOSIS — B182 Chronic viral hepatitis C: Secondary | ICD-10-CM | POA: Diagnosis not present

## 2018-02-16 DIAGNOSIS — B182 Chronic viral hepatitis C: Secondary | ICD-10-CM | POA: Diagnosis not present

## 2018-02-17 DIAGNOSIS — B182 Chronic viral hepatitis C: Secondary | ICD-10-CM | POA: Diagnosis not present

## 2018-02-18 DIAGNOSIS — B182 Chronic viral hepatitis C: Secondary | ICD-10-CM | POA: Diagnosis not present

## 2018-02-19 DIAGNOSIS — B182 Chronic viral hepatitis C: Secondary | ICD-10-CM | POA: Diagnosis not present

## 2018-02-20 DIAGNOSIS — B182 Chronic viral hepatitis C: Secondary | ICD-10-CM | POA: Diagnosis not present

## 2018-02-21 DIAGNOSIS — B182 Chronic viral hepatitis C: Secondary | ICD-10-CM | POA: Diagnosis not present

## 2018-02-22 DIAGNOSIS — B182 Chronic viral hepatitis C: Secondary | ICD-10-CM | POA: Diagnosis not present

## 2018-02-23 DIAGNOSIS — B182 Chronic viral hepatitis C: Secondary | ICD-10-CM | POA: Diagnosis not present

## 2018-02-24 DIAGNOSIS — B182 Chronic viral hepatitis C: Secondary | ICD-10-CM | POA: Diagnosis not present

## 2018-02-25 DIAGNOSIS — B182 Chronic viral hepatitis C: Secondary | ICD-10-CM | POA: Diagnosis not present

## 2018-02-26 DIAGNOSIS — B182 Chronic viral hepatitis C: Secondary | ICD-10-CM | POA: Diagnosis not present

## 2018-02-27 DIAGNOSIS — B182 Chronic viral hepatitis C: Secondary | ICD-10-CM | POA: Diagnosis not present

## 2018-02-28 DIAGNOSIS — B182 Chronic viral hepatitis C: Secondary | ICD-10-CM | POA: Diagnosis not present

## 2018-03-01 DIAGNOSIS — B182 Chronic viral hepatitis C: Secondary | ICD-10-CM | POA: Diagnosis not present

## 2018-03-02 DIAGNOSIS — B182 Chronic viral hepatitis C: Secondary | ICD-10-CM | POA: Diagnosis not present

## 2018-03-03 DIAGNOSIS — B182 Chronic viral hepatitis C: Secondary | ICD-10-CM | POA: Diagnosis not present

## 2018-03-04 DIAGNOSIS — B182 Chronic viral hepatitis C: Secondary | ICD-10-CM | POA: Diagnosis not present

## 2018-03-05 DIAGNOSIS — B182 Chronic viral hepatitis C: Secondary | ICD-10-CM | POA: Diagnosis not present

## 2018-03-06 DIAGNOSIS — B182 Chronic viral hepatitis C: Secondary | ICD-10-CM | POA: Diagnosis not present

## 2018-03-07 DIAGNOSIS — B182 Chronic viral hepatitis C: Secondary | ICD-10-CM | POA: Diagnosis not present

## 2018-03-08 DIAGNOSIS — B182 Chronic viral hepatitis C: Secondary | ICD-10-CM | POA: Diagnosis not present

## 2018-03-09 DIAGNOSIS — B182 Chronic viral hepatitis C: Secondary | ICD-10-CM | POA: Diagnosis not present

## 2018-03-10 DIAGNOSIS — B182 Chronic viral hepatitis C: Secondary | ICD-10-CM | POA: Diagnosis not present

## 2018-03-11 DIAGNOSIS — B182 Chronic viral hepatitis C: Secondary | ICD-10-CM | POA: Diagnosis not present

## 2018-03-12 DIAGNOSIS — B182 Chronic viral hepatitis C: Secondary | ICD-10-CM | POA: Diagnosis not present

## 2018-03-13 DIAGNOSIS — B182 Chronic viral hepatitis C: Secondary | ICD-10-CM | POA: Diagnosis not present

## 2018-03-14 DIAGNOSIS — B182 Chronic viral hepatitis C: Secondary | ICD-10-CM | POA: Diagnosis not present

## 2018-03-15 DIAGNOSIS — B182 Chronic viral hepatitis C: Secondary | ICD-10-CM | POA: Diagnosis not present

## 2018-03-16 DIAGNOSIS — B182 Chronic viral hepatitis C: Secondary | ICD-10-CM | POA: Diagnosis not present

## 2018-03-17 DIAGNOSIS — B182 Chronic viral hepatitis C: Secondary | ICD-10-CM | POA: Diagnosis not present

## 2018-03-18 DIAGNOSIS — B182 Chronic viral hepatitis C: Secondary | ICD-10-CM | POA: Diagnosis not present

## 2018-03-19 DIAGNOSIS — M545 Low back pain: Secondary | ICD-10-CM | POA: Diagnosis not present

## 2018-03-19 DIAGNOSIS — B182 Chronic viral hepatitis C: Secondary | ICD-10-CM | POA: Diagnosis not present

## 2018-03-19 DIAGNOSIS — G9009 Other idiopathic peripheral autonomic neuropathy: Secondary | ICD-10-CM | POA: Diagnosis not present

## 2018-03-19 DIAGNOSIS — M549 Dorsalgia, unspecified: Secondary | ICD-10-CM | POA: Diagnosis not present

## 2018-03-20 DIAGNOSIS — B182 Chronic viral hepatitis C: Secondary | ICD-10-CM | POA: Diagnosis not present

## 2018-03-21 DIAGNOSIS — B182 Chronic viral hepatitis C: Secondary | ICD-10-CM | POA: Diagnosis not present

## 2018-03-22 DIAGNOSIS — B182 Chronic viral hepatitis C: Secondary | ICD-10-CM | POA: Diagnosis not present

## 2018-03-23 DIAGNOSIS — B182 Chronic viral hepatitis C: Secondary | ICD-10-CM | POA: Diagnosis not present

## 2018-03-24 DIAGNOSIS — B182 Chronic viral hepatitis C: Secondary | ICD-10-CM | POA: Diagnosis not present

## 2018-03-25 DIAGNOSIS — B182 Chronic viral hepatitis C: Secondary | ICD-10-CM | POA: Diagnosis not present

## 2018-03-26 DIAGNOSIS — B182 Chronic viral hepatitis C: Secondary | ICD-10-CM | POA: Diagnosis not present

## 2018-03-27 DIAGNOSIS — B182 Chronic viral hepatitis C: Secondary | ICD-10-CM | POA: Diagnosis not present

## 2018-03-28 DIAGNOSIS — B182 Chronic viral hepatitis C: Secondary | ICD-10-CM | POA: Diagnosis not present

## 2018-03-29 DIAGNOSIS — B182 Chronic viral hepatitis C: Secondary | ICD-10-CM | POA: Diagnosis not present

## 2018-03-30 DIAGNOSIS — B182 Chronic viral hepatitis C: Secondary | ICD-10-CM | POA: Diagnosis not present

## 2018-03-31 DIAGNOSIS — B182 Chronic viral hepatitis C: Secondary | ICD-10-CM | POA: Diagnosis not present

## 2018-04-01 DIAGNOSIS — B182 Chronic viral hepatitis C: Secondary | ICD-10-CM | POA: Diagnosis not present

## 2018-04-02 DIAGNOSIS — B182 Chronic viral hepatitis C: Secondary | ICD-10-CM | POA: Diagnosis not present

## 2018-04-03 DIAGNOSIS — B182 Chronic viral hepatitis C: Secondary | ICD-10-CM | POA: Diagnosis not present

## 2018-04-04 DIAGNOSIS — B182 Chronic viral hepatitis C: Secondary | ICD-10-CM | POA: Diagnosis not present

## 2018-04-05 DIAGNOSIS — B182 Chronic viral hepatitis C: Secondary | ICD-10-CM | POA: Diagnosis not present

## 2018-04-05 DIAGNOSIS — F14120 Cocaine abuse with intoxication, uncomplicated: Secondary | ICD-10-CM | POA: Diagnosis not present

## 2018-04-05 DIAGNOSIS — F10120 Alcohol abuse with intoxication, uncomplicated: Secondary | ICD-10-CM | POA: Diagnosis not present

## 2018-04-06 DIAGNOSIS — B182 Chronic viral hepatitis C: Secondary | ICD-10-CM | POA: Diagnosis not present

## 2018-04-07 DIAGNOSIS — B182 Chronic viral hepatitis C: Secondary | ICD-10-CM | POA: Diagnosis not present

## 2018-04-08 DIAGNOSIS — B182 Chronic viral hepatitis C: Secondary | ICD-10-CM | POA: Diagnosis not present

## 2018-04-09 DIAGNOSIS — B182 Chronic viral hepatitis C: Secondary | ICD-10-CM | POA: Diagnosis not present

## 2018-04-10 DIAGNOSIS — B182 Chronic viral hepatitis C: Secondary | ICD-10-CM | POA: Diagnosis not present

## 2018-04-11 DIAGNOSIS — B182 Chronic viral hepatitis C: Secondary | ICD-10-CM | POA: Diagnosis not present

## 2018-04-12 DIAGNOSIS — B182 Chronic viral hepatitis C: Secondary | ICD-10-CM | POA: Diagnosis not present

## 2018-04-13 DIAGNOSIS — B182 Chronic viral hepatitis C: Secondary | ICD-10-CM | POA: Diagnosis not present

## 2018-04-14 DIAGNOSIS — B182 Chronic viral hepatitis C: Secondary | ICD-10-CM | POA: Diagnosis not present

## 2018-04-15 DIAGNOSIS — B182 Chronic viral hepatitis C: Secondary | ICD-10-CM | POA: Diagnosis not present

## 2018-04-16 DIAGNOSIS — B182 Chronic viral hepatitis C: Secondary | ICD-10-CM | POA: Diagnosis not present

## 2018-04-17 DIAGNOSIS — B182 Chronic viral hepatitis C: Secondary | ICD-10-CM | POA: Diagnosis not present

## 2018-04-18 DIAGNOSIS — B182 Chronic viral hepatitis C: Secondary | ICD-10-CM | POA: Diagnosis not present

## 2018-04-19 DIAGNOSIS — B182 Chronic viral hepatitis C: Secondary | ICD-10-CM | POA: Diagnosis not present

## 2018-04-20 DIAGNOSIS — B182 Chronic viral hepatitis C: Secondary | ICD-10-CM | POA: Diagnosis not present

## 2018-04-21 DIAGNOSIS — B182 Chronic viral hepatitis C: Secondary | ICD-10-CM | POA: Diagnosis not present

## 2018-04-22 DIAGNOSIS — B182 Chronic viral hepatitis C: Secondary | ICD-10-CM | POA: Diagnosis not present

## 2018-04-23 DIAGNOSIS — B182 Chronic viral hepatitis C: Secondary | ICD-10-CM | POA: Diagnosis not present

## 2018-04-24 DIAGNOSIS — B182 Chronic viral hepatitis C: Secondary | ICD-10-CM | POA: Diagnosis not present

## 2018-04-25 DIAGNOSIS — B182 Chronic viral hepatitis C: Secondary | ICD-10-CM | POA: Diagnosis not present

## 2018-04-26 DIAGNOSIS — B182 Chronic viral hepatitis C: Secondary | ICD-10-CM | POA: Diagnosis not present

## 2018-04-27 DIAGNOSIS — B182 Chronic viral hepatitis C: Secondary | ICD-10-CM | POA: Diagnosis not present

## 2018-04-28 DIAGNOSIS — B182 Chronic viral hepatitis C: Secondary | ICD-10-CM | POA: Diagnosis not present

## 2018-04-29 DIAGNOSIS — B182 Chronic viral hepatitis C: Secondary | ICD-10-CM | POA: Diagnosis not present

## 2018-04-30 DIAGNOSIS — B182 Chronic viral hepatitis C: Secondary | ICD-10-CM | POA: Diagnosis not present

## 2018-05-01 DIAGNOSIS — B182 Chronic viral hepatitis C: Secondary | ICD-10-CM | POA: Diagnosis not present

## 2018-05-02 DIAGNOSIS — B182 Chronic viral hepatitis C: Secondary | ICD-10-CM | POA: Diagnosis not present

## 2018-05-03 DIAGNOSIS — B182 Chronic viral hepatitis C: Secondary | ICD-10-CM | POA: Diagnosis not present

## 2018-05-04 DIAGNOSIS — B182 Chronic viral hepatitis C: Secondary | ICD-10-CM | POA: Diagnosis not present

## 2018-05-05 DIAGNOSIS — B182 Chronic viral hepatitis C: Secondary | ICD-10-CM | POA: Diagnosis not present

## 2018-05-06 DIAGNOSIS — B182 Chronic viral hepatitis C: Secondary | ICD-10-CM | POA: Diagnosis not present

## 2018-05-07 DIAGNOSIS — B182 Chronic viral hepatitis C: Secondary | ICD-10-CM | POA: Diagnosis not present

## 2018-05-08 DIAGNOSIS — B182 Chronic viral hepatitis C: Secondary | ICD-10-CM | POA: Diagnosis not present

## 2018-05-09 DIAGNOSIS — B182 Chronic viral hepatitis C: Secondary | ICD-10-CM | POA: Diagnosis not present

## 2018-05-10 DIAGNOSIS — B182 Chronic viral hepatitis C: Secondary | ICD-10-CM | POA: Diagnosis not present

## 2018-05-11 DIAGNOSIS — B182 Chronic viral hepatitis C: Secondary | ICD-10-CM | POA: Diagnosis not present

## 2018-05-12 DIAGNOSIS — B182 Chronic viral hepatitis C: Secondary | ICD-10-CM | POA: Diagnosis not present

## 2018-05-13 DIAGNOSIS — B182 Chronic viral hepatitis C: Secondary | ICD-10-CM | POA: Diagnosis not present

## 2018-05-14 DIAGNOSIS — B182 Chronic viral hepatitis C: Secondary | ICD-10-CM | POA: Diagnosis not present

## 2018-05-15 DIAGNOSIS — B182 Chronic viral hepatitis C: Secondary | ICD-10-CM | POA: Diagnosis not present

## 2018-05-16 DIAGNOSIS — B182 Chronic viral hepatitis C: Secondary | ICD-10-CM | POA: Diagnosis not present

## 2018-05-17 DIAGNOSIS — B182 Chronic viral hepatitis C: Secondary | ICD-10-CM | POA: Diagnosis not present

## 2018-05-18 DIAGNOSIS — B182 Chronic viral hepatitis C: Secondary | ICD-10-CM | POA: Diagnosis not present

## 2018-05-19 DIAGNOSIS — B182 Chronic viral hepatitis C: Secondary | ICD-10-CM | POA: Diagnosis not present

## 2018-05-20 DIAGNOSIS — B182 Chronic viral hepatitis C: Secondary | ICD-10-CM | POA: Diagnosis not present

## 2018-05-21 DIAGNOSIS — B182 Chronic viral hepatitis C: Secondary | ICD-10-CM | POA: Diagnosis not present

## 2018-05-23 DIAGNOSIS — B182 Chronic viral hepatitis C: Secondary | ICD-10-CM | POA: Diagnosis not present

## 2018-05-24 DIAGNOSIS — B182 Chronic viral hepatitis C: Secondary | ICD-10-CM | POA: Diagnosis not present

## 2018-05-25 DIAGNOSIS — B182 Chronic viral hepatitis C: Secondary | ICD-10-CM | POA: Diagnosis not present

## 2018-05-26 DIAGNOSIS — B182 Chronic viral hepatitis C: Secondary | ICD-10-CM | POA: Diagnosis not present

## 2018-05-27 DIAGNOSIS — B182 Chronic viral hepatitis C: Secondary | ICD-10-CM | POA: Diagnosis not present

## 2018-05-28 DIAGNOSIS — B182 Chronic viral hepatitis C: Secondary | ICD-10-CM | POA: Diagnosis not present

## 2018-05-29 DIAGNOSIS — B182 Chronic viral hepatitis C: Secondary | ICD-10-CM | POA: Diagnosis not present

## 2018-05-30 DIAGNOSIS — B182 Chronic viral hepatitis C: Secondary | ICD-10-CM | POA: Diagnosis not present

## 2018-05-31 DIAGNOSIS — B182 Chronic viral hepatitis C: Secondary | ICD-10-CM | POA: Diagnosis not present

## 2018-06-01 DIAGNOSIS — B182 Chronic viral hepatitis C: Secondary | ICD-10-CM | POA: Diagnosis not present

## 2018-06-02 DIAGNOSIS — B182 Chronic viral hepatitis C: Secondary | ICD-10-CM | POA: Diagnosis not present

## 2018-06-03 DIAGNOSIS — B182 Chronic viral hepatitis C: Secondary | ICD-10-CM | POA: Diagnosis not present

## 2018-06-04 DIAGNOSIS — B182 Chronic viral hepatitis C: Secondary | ICD-10-CM | POA: Diagnosis not present

## 2018-06-05 DIAGNOSIS — B182 Chronic viral hepatitis C: Secondary | ICD-10-CM | POA: Diagnosis not present

## 2018-06-06 DIAGNOSIS — B182 Chronic viral hepatitis C: Secondary | ICD-10-CM | POA: Diagnosis not present

## 2018-06-07 DIAGNOSIS — B182 Chronic viral hepatitis C: Secondary | ICD-10-CM | POA: Diagnosis not present

## 2018-06-08 DIAGNOSIS — B182 Chronic viral hepatitis C: Secondary | ICD-10-CM | POA: Diagnosis not present

## 2018-06-09 DIAGNOSIS — B182 Chronic viral hepatitis C: Secondary | ICD-10-CM | POA: Diagnosis not present

## 2018-06-10 DIAGNOSIS — B182 Chronic viral hepatitis C: Secondary | ICD-10-CM | POA: Diagnosis not present

## 2018-06-11 DIAGNOSIS — B182 Chronic viral hepatitis C: Secondary | ICD-10-CM | POA: Diagnosis not present

## 2018-06-12 DIAGNOSIS — B182 Chronic viral hepatitis C: Secondary | ICD-10-CM | POA: Diagnosis not present

## 2018-06-13 DIAGNOSIS — B182 Chronic viral hepatitis C: Secondary | ICD-10-CM | POA: Diagnosis not present

## 2018-06-14 DIAGNOSIS — B182 Chronic viral hepatitis C: Secondary | ICD-10-CM | POA: Diagnosis not present

## 2018-06-15 DIAGNOSIS — B182 Chronic viral hepatitis C: Secondary | ICD-10-CM | POA: Diagnosis not present

## 2018-06-16 DIAGNOSIS — B182 Chronic viral hepatitis C: Secondary | ICD-10-CM | POA: Diagnosis not present

## 2018-06-17 DIAGNOSIS — B182 Chronic viral hepatitis C: Secondary | ICD-10-CM | POA: Diagnosis not present

## 2018-06-18 DIAGNOSIS — B182 Chronic viral hepatitis C: Secondary | ICD-10-CM | POA: Diagnosis not present

## 2018-06-19 DIAGNOSIS — B182 Chronic viral hepatitis C: Secondary | ICD-10-CM | POA: Diagnosis not present

## 2018-06-20 DIAGNOSIS — B182 Chronic viral hepatitis C: Secondary | ICD-10-CM | POA: Diagnosis not present

## 2018-06-21 DIAGNOSIS — B182 Chronic viral hepatitis C: Secondary | ICD-10-CM | POA: Diagnosis not present

## 2018-06-22 DIAGNOSIS — B182 Chronic viral hepatitis C: Secondary | ICD-10-CM | POA: Diagnosis not present

## 2018-06-23 DIAGNOSIS — B182 Chronic viral hepatitis C: Secondary | ICD-10-CM | POA: Diagnosis not present

## 2018-06-24 DIAGNOSIS — B182 Chronic viral hepatitis C: Secondary | ICD-10-CM | POA: Diagnosis not present

## 2018-06-25 DIAGNOSIS — B182 Chronic viral hepatitis C: Secondary | ICD-10-CM | POA: Diagnosis not present

## 2018-06-26 DIAGNOSIS — B182 Chronic viral hepatitis C: Secondary | ICD-10-CM | POA: Diagnosis not present

## 2018-06-27 DIAGNOSIS — B182 Chronic viral hepatitis C: Secondary | ICD-10-CM | POA: Diagnosis not present

## 2018-06-28 DIAGNOSIS — B182 Chronic viral hepatitis C: Secondary | ICD-10-CM | POA: Diagnosis not present

## 2018-06-29 DIAGNOSIS — B182 Chronic viral hepatitis C: Secondary | ICD-10-CM | POA: Diagnosis not present

## 2018-06-30 DIAGNOSIS — B182 Chronic viral hepatitis C: Secondary | ICD-10-CM | POA: Diagnosis not present

## 2018-07-01 DIAGNOSIS — B182 Chronic viral hepatitis C: Secondary | ICD-10-CM | POA: Diagnosis not present

## 2018-07-02 DIAGNOSIS — B182 Chronic viral hepatitis C: Secondary | ICD-10-CM | POA: Diagnosis not present

## 2018-07-03 DIAGNOSIS — B182 Chronic viral hepatitis C: Secondary | ICD-10-CM | POA: Diagnosis not present

## 2018-07-04 DIAGNOSIS — B182 Chronic viral hepatitis C: Secondary | ICD-10-CM | POA: Diagnosis not present

## 2018-07-05 DIAGNOSIS — B182 Chronic viral hepatitis C: Secondary | ICD-10-CM | POA: Diagnosis not present

## 2018-07-06 DIAGNOSIS — B182 Chronic viral hepatitis C: Secondary | ICD-10-CM | POA: Diagnosis not present

## 2018-07-07 DIAGNOSIS — B182 Chronic viral hepatitis C: Secondary | ICD-10-CM | POA: Diagnosis not present

## 2018-07-08 DIAGNOSIS — B182 Chronic viral hepatitis C: Secondary | ICD-10-CM | POA: Diagnosis not present

## 2018-07-09 DIAGNOSIS — B182 Chronic viral hepatitis C: Secondary | ICD-10-CM | POA: Diagnosis not present

## 2018-07-10 DIAGNOSIS — B182 Chronic viral hepatitis C: Secondary | ICD-10-CM | POA: Diagnosis not present

## 2018-07-11 DIAGNOSIS — B182 Chronic viral hepatitis C: Secondary | ICD-10-CM | POA: Diagnosis not present

## 2018-07-12 DIAGNOSIS — B182 Chronic viral hepatitis C: Secondary | ICD-10-CM | POA: Diagnosis not present

## 2018-07-13 DIAGNOSIS — B182 Chronic viral hepatitis C: Secondary | ICD-10-CM | POA: Diagnosis not present

## 2018-07-14 DIAGNOSIS — B182 Chronic viral hepatitis C: Secondary | ICD-10-CM | POA: Diagnosis not present

## 2018-07-15 DIAGNOSIS — B182 Chronic viral hepatitis C: Secondary | ICD-10-CM | POA: Diagnosis not present

## 2018-07-16 DIAGNOSIS — B182 Chronic viral hepatitis C: Secondary | ICD-10-CM | POA: Diagnosis not present

## 2018-07-17 DIAGNOSIS — B182 Chronic viral hepatitis C: Secondary | ICD-10-CM | POA: Diagnosis not present

## 2018-07-18 DIAGNOSIS — B182 Chronic viral hepatitis C: Secondary | ICD-10-CM | POA: Diagnosis not present

## 2018-07-19 DIAGNOSIS — B182 Chronic viral hepatitis C: Secondary | ICD-10-CM | POA: Diagnosis not present

## 2018-07-20 DIAGNOSIS — B182 Chronic viral hepatitis C: Secondary | ICD-10-CM | POA: Diagnosis not present

## 2018-07-21 DIAGNOSIS — B182 Chronic viral hepatitis C: Secondary | ICD-10-CM | POA: Diagnosis not present

## 2018-07-22 DIAGNOSIS — B182 Chronic viral hepatitis C: Secondary | ICD-10-CM | POA: Diagnosis not present

## 2018-07-23 DIAGNOSIS — B182 Chronic viral hepatitis C: Secondary | ICD-10-CM | POA: Diagnosis not present

## 2018-07-24 DIAGNOSIS — R109 Unspecified abdominal pain: Secondary | ICD-10-CM | POA: Diagnosis not present

## 2018-07-24 DIAGNOSIS — B182 Chronic viral hepatitis C: Secondary | ICD-10-CM | POA: Diagnosis not present

## 2018-07-24 DIAGNOSIS — W19XXXA Unspecified fall, initial encounter: Secondary | ICD-10-CM | POA: Diagnosis not present

## 2018-07-24 DIAGNOSIS — R1012 Left upper quadrant pain: Secondary | ICD-10-CM | POA: Diagnosis not present

## 2018-07-25 DIAGNOSIS — B182 Chronic viral hepatitis C: Secondary | ICD-10-CM | POA: Diagnosis not present

## 2018-07-26 DIAGNOSIS — B182 Chronic viral hepatitis C: Secondary | ICD-10-CM | POA: Diagnosis not present

## 2018-07-27 DIAGNOSIS — B182 Chronic viral hepatitis C: Secondary | ICD-10-CM | POA: Diagnosis not present

## 2018-08-06 DIAGNOSIS — K769 Liver disease, unspecified: Secondary | ICD-10-CM | POA: Diagnosis not present

## 2018-08-28 DIAGNOSIS — B182 Chronic viral hepatitis C: Secondary | ICD-10-CM | POA: Diagnosis not present

## 2018-08-29 DIAGNOSIS — B182 Chronic viral hepatitis C: Secondary | ICD-10-CM | POA: Diagnosis not present

## 2018-08-30 DIAGNOSIS — B182 Chronic viral hepatitis C: Secondary | ICD-10-CM | POA: Diagnosis not present

## 2018-08-31 DIAGNOSIS — B182 Chronic viral hepatitis C: Secondary | ICD-10-CM | POA: Diagnosis not present

## 2018-09-01 DIAGNOSIS — B182 Chronic viral hepatitis C: Secondary | ICD-10-CM | POA: Diagnosis not present

## 2018-09-02 DIAGNOSIS — B182 Chronic viral hepatitis C: Secondary | ICD-10-CM | POA: Diagnosis not present

## 2018-09-03 DIAGNOSIS — B182 Chronic viral hepatitis C: Secondary | ICD-10-CM | POA: Diagnosis not present

## 2018-09-04 DIAGNOSIS — B182 Chronic viral hepatitis C: Secondary | ICD-10-CM | POA: Diagnosis not present

## 2018-09-05 DIAGNOSIS — B182 Chronic viral hepatitis C: Secondary | ICD-10-CM | POA: Diagnosis not present

## 2018-09-06 DIAGNOSIS — B182 Chronic viral hepatitis C: Secondary | ICD-10-CM | POA: Diagnosis not present

## 2018-09-07 DIAGNOSIS — B182 Chronic viral hepatitis C: Secondary | ICD-10-CM | POA: Diagnosis not present

## 2018-09-08 DIAGNOSIS — B182 Chronic viral hepatitis C: Secondary | ICD-10-CM | POA: Diagnosis not present

## 2018-09-09 DIAGNOSIS — B182 Chronic viral hepatitis C: Secondary | ICD-10-CM | POA: Diagnosis not present

## 2018-09-10 DIAGNOSIS — B182 Chronic viral hepatitis C: Secondary | ICD-10-CM | POA: Diagnosis not present

## 2018-09-11 DIAGNOSIS — B182 Chronic viral hepatitis C: Secondary | ICD-10-CM | POA: Diagnosis not present

## 2018-09-12 DIAGNOSIS — B182 Chronic viral hepatitis C: Secondary | ICD-10-CM | POA: Diagnosis not present

## 2018-09-13 DIAGNOSIS — B182 Chronic viral hepatitis C: Secondary | ICD-10-CM | POA: Diagnosis not present

## 2018-09-14 DIAGNOSIS — B182 Chronic viral hepatitis C: Secondary | ICD-10-CM | POA: Diagnosis not present

## 2018-09-15 DIAGNOSIS — B182 Chronic viral hepatitis C: Secondary | ICD-10-CM | POA: Diagnosis not present

## 2018-09-16 DIAGNOSIS — B182 Chronic viral hepatitis C: Secondary | ICD-10-CM | POA: Diagnosis not present

## 2018-09-17 DIAGNOSIS — B182 Chronic viral hepatitis C: Secondary | ICD-10-CM | POA: Diagnosis not present

## 2018-09-18 DIAGNOSIS — B182 Chronic viral hepatitis C: Secondary | ICD-10-CM | POA: Diagnosis not present

## 2018-09-19 DIAGNOSIS — B182 Chronic viral hepatitis C: Secondary | ICD-10-CM | POA: Diagnosis not present

## 2018-09-20 DIAGNOSIS — B182 Chronic viral hepatitis C: Secondary | ICD-10-CM | POA: Diagnosis not present

## 2018-09-21 DIAGNOSIS — B182 Chronic viral hepatitis C: Secondary | ICD-10-CM | POA: Diagnosis not present

## 2018-09-22 DIAGNOSIS — B182 Chronic viral hepatitis C: Secondary | ICD-10-CM | POA: Diagnosis not present

## 2018-09-23 DIAGNOSIS — B182 Chronic viral hepatitis C: Secondary | ICD-10-CM | POA: Diagnosis not present

## 2018-09-24 DIAGNOSIS — B182 Chronic viral hepatitis C: Secondary | ICD-10-CM | POA: Diagnosis not present

## 2018-09-25 DIAGNOSIS — B182 Chronic viral hepatitis C: Secondary | ICD-10-CM | POA: Diagnosis not present

## 2018-09-26 DIAGNOSIS — B182 Chronic viral hepatitis C: Secondary | ICD-10-CM | POA: Diagnosis not present

## 2019-03-13 DIAGNOSIS — R0781 Pleurodynia: Secondary | ICD-10-CM | POA: Diagnosis not present
